# Patient Record
Sex: Male | Born: 1940
Health system: Southern US, Community
[De-identification: ages and names within clinical notes are randomized; demographics above are authoritative.]

## PROBLEM LIST (undated history)

## (undated) DIAGNOSIS — I10 Essential (primary) hypertension: Secondary | ICD-10-CM

## (undated) DIAGNOSIS — E785 Hyperlipidemia, unspecified: Secondary | ICD-10-CM

## (undated) DIAGNOSIS — K859 Acute pancreatitis without necrosis or infection, unspecified: Secondary | ICD-10-CM

## (undated) HISTORY — PX: CHOLECYSTECTOMY: SHX55

## (undated) HISTORY — DX: Essential (primary) hypertension: I10

## (undated) HISTORY — DX: Hyperlipidemia, unspecified: E78.5

## (undated) HISTORY — DX: Acute pancreatitis without necrosis or infection, unspecified: K85.90

---

## 2005-12-16 ENCOUNTER — Inpatient Hospital Stay (HOSPITAL_COMMUNITY): Admission: EM | Admit: 2005-12-16 | Discharge: 2005-12-17 | Payer: Self-pay | Admitting: Emergency Medicine

## 2006-08-08 ENCOUNTER — Ambulatory Visit (HOSPITAL_BASED_OUTPATIENT_CLINIC_OR_DEPARTMENT_OTHER): Admission: RE | Admit: 2006-08-08 | Discharge: 2006-08-09 | Payer: Self-pay | Admitting: Orthopedic Surgery

## 2010-06-30 DIAGNOSIS — K859 Acute pancreatitis without necrosis or infection, unspecified: Secondary | ICD-10-CM

## 2010-06-30 HISTORY — DX: Acute pancreatitis without necrosis or infection, unspecified: K85.90

## 2010-07-15 ENCOUNTER — Ambulatory Visit: Payer: Self-pay | Admitting: Gastroenterology

## 2010-07-16 ENCOUNTER — Inpatient Hospital Stay (HOSPITAL_COMMUNITY): Admission: EM | Admit: 2010-07-16 | Discharge: 2010-08-12 | Payer: Self-pay | Admitting: Emergency Medicine

## 2010-07-22 ENCOUNTER — Encounter (INDEPENDENT_AMBULATORY_CARE_PROVIDER_SITE_OTHER): Payer: Self-pay | Admitting: Internal Medicine

## 2010-08-25 ENCOUNTER — Ambulatory Visit (HOSPITAL_COMMUNITY): Admission: RE | Admit: 2010-08-25 | Discharge: 2010-08-25 | Payer: Self-pay

## 2010-08-30 HISTORY — PX: STERNOTOMY: SHX1057

## 2010-09-12 ENCOUNTER — Ambulatory Visit (HOSPITAL_COMMUNITY): Admission: RE | Admit: 2010-09-12 | Discharge: 2010-09-12 | Payer: Self-pay

## 2010-09-21 ENCOUNTER — Ambulatory Visit: Payer: Self-pay | Admitting: Oncology

## 2010-09-26 LAB — CBC WITH DIFFERENTIAL/PLATELET
BASO%: 0.1 % (ref 0.0–2.0)
EOS%: 3.5 % (ref 0.0–7.0)
LYMPH%: 46.2 % (ref 14.0–49.0)
MCH: 32.9 pg (ref 27.2–33.4)
MCHC: 34.8 g/dL (ref 32.0–36.0)
MCV: 94.7 fL (ref 79.3–98.0)
MONO%: 11.9 % (ref 0.0–14.0)
Platelets: 274 10*3/uL (ref 140–400)
RBC: 4.06 10*6/uL — ABNORMAL LOW (ref 4.20–5.82)

## 2010-09-26 LAB — COMPREHENSIVE METABOLIC PANEL
ALT: 10 U/L (ref 0–53)
Alkaline Phosphatase: 63 U/L (ref 39–117)
Glucose, Bld: 105 mg/dL — ABNORMAL HIGH (ref 70–99)
Sodium: 140 mEq/L (ref 135–145)
Total Bilirubin: 0.5 mg/dL (ref 0.3–1.2)
Total Protein: 6.6 g/dL (ref 6.0–8.3)

## 2010-10-03 ENCOUNTER — Ambulatory Visit
Admission: RE | Admit: 2010-10-03 | Discharge: 2010-10-27 | Payer: Self-pay | Source: Home / Self Care | Attending: Radiation Oncology | Admitting: Radiation Oncology

## 2010-10-11 ENCOUNTER — Ambulatory Visit: Payer: Self-pay | Admitting: Thoracic Surgery

## 2010-10-17 ENCOUNTER — Encounter: Payer: Self-pay | Admitting: Thoracic Surgery

## 2010-10-17 ENCOUNTER — Inpatient Hospital Stay (HOSPITAL_COMMUNITY)
Admission: RE | Admit: 2010-10-17 | Discharge: 2010-10-20 | Payer: Self-pay | Source: Home / Self Care | Attending: Thoracic Surgery | Admitting: Thoracic Surgery

## 2010-10-17 HISTORY — PX: STERNOTOMY: SHX1057

## 2010-10-25 ENCOUNTER — Ambulatory Visit: Payer: Self-pay | Admitting: Cardiothoracic Surgery

## 2010-10-26 ENCOUNTER — Ambulatory Visit: Payer: Self-pay | Admitting: Oncology

## 2010-11-10 ENCOUNTER — Ambulatory Visit
Admission: RE | Admit: 2010-11-10 | Discharge: 2010-11-10 | Payer: Self-pay | Source: Home / Self Care | Attending: Cardiothoracic Surgery | Admitting: Cardiothoracic Surgery

## 2010-11-10 ENCOUNTER — Encounter
Admission: RE | Admit: 2010-11-10 | Discharge: 2010-11-10 | Payer: Self-pay | Source: Home / Self Care | Attending: Cardiothoracic Surgery | Admitting: Cardiothoracic Surgery

## 2010-11-17 ENCOUNTER — Ambulatory Visit
Admission: RE | Admit: 2010-11-17 | Discharge: 2010-11-29 | Payer: Self-pay | Source: Home / Self Care | Attending: Radiation Oncology | Admitting: Radiation Oncology

## 2010-11-18 ENCOUNTER — Other Ambulatory Visit: Payer: Self-pay | Admitting: Oncology

## 2011-01-09 LAB — POCT I-STAT 3, ART BLOOD GAS (G3+)
Patient temperature: 97.7
pCO2 arterial: 35.7 mmHg (ref 35.0–45.0)
pH, Arterial: 7.481 — ABNORMAL HIGH (ref 7.350–7.450)

## 2011-01-09 LAB — BLOOD GAS, ARTERIAL
Bicarbonate: 24.6 mEq/L — ABNORMAL HIGH (ref 20.0–24.0)
Patient temperature: 98.6
TCO2: 25.7 mmol/L (ref 0–100)
pH, Arterial: 7.427 (ref 7.350–7.450)

## 2011-01-09 LAB — CBC
HCT: 33.9 % — ABNORMAL LOW (ref 39.0–52.0)
HCT: 38 % — ABNORMAL LOW (ref 39.0–52.0)
Hemoglobin: 11.8 g/dL — ABNORMAL LOW (ref 13.0–17.0)
Hemoglobin: 13.3 g/dL (ref 13.0–17.0)
MCH: 31.1 pg (ref 26.0–34.0)
MCH: 31.8 pg (ref 26.0–34.0)
MCH: 32.2 pg (ref 26.0–34.0)
MCHC: 33.8 g/dL (ref 30.0–36.0)
MCHC: 34.8 g/dL (ref 30.0–36.0)
MCHC: 35 g/dL (ref 30.0–36.0)
MCV: 92.6 fL (ref 78.0–100.0)
Platelets: 204 10*3/uL (ref 150–400)
Platelets: 209 10*3/uL (ref 150–400)
RBC: 3.66 MIL/uL — ABNORMAL LOW (ref 4.22–5.81)
RBC: 3.71 MIL/uL — ABNORMAL LOW (ref 4.22–5.81)
RDW: 14.3 % (ref 11.5–15.5)
RDW: 14.4 % (ref 11.5–15.5)
RDW: 14.6 % (ref 11.5–15.5)
WBC: 5.9 10*3/uL (ref 4.0–10.5)
WBC: 7.5 10*3/uL (ref 4.0–10.5)

## 2011-01-09 LAB — COMPREHENSIVE METABOLIC PANEL
ALT: 10 U/L (ref 0–53)
AST: 15 U/L (ref 0–37)
Albumin: 3.3 g/dL — ABNORMAL LOW (ref 3.5–5.2)
Alkaline Phosphatase: 60 U/L (ref 39–117)
BUN: 5 mg/dL — ABNORMAL LOW (ref 6–23)
CO2: 24 mEq/L (ref 19–32)
Chloride: 102 mEq/L (ref 96–112)
Chloride: 106 mEq/L (ref 96–112)
Creatinine, Ser: 0.8 mg/dL (ref 0.4–1.5)
GFR calc Af Amer: >60 mL/min (ref >60)
GFR calc non Af Amer: >60 mL/min (ref >60)
Glucose, Bld: 111 mg/dL — ABNORMAL HIGH (ref 70–99)
Potassium: 3.9 mEq/L (ref 3.5–5.1)
Potassium: 3.9 mEq/L (ref 3.5–5.1)
Sodium: 138 mEq/L (ref 135–145)
Total Bilirubin: 0.7 mg/dL (ref 0.3–1.2)
Total Bilirubin: 0.8 mg/dL (ref 0.3–1.2)
Total Protein: 6.2 g/dL (ref 6.0–8.3)

## 2011-01-09 LAB — URINE MICROSCOPIC-ADD ON

## 2011-01-09 LAB — BASIC METABOLIC PANEL
BUN: 11 mg/dL (ref 6–23)
BUN: 8 mg/dL (ref 6–23)
CO2: 26 mEq/L (ref 19–32)
Calcium: 8.5 mg/dL (ref 8.4–10.5)
Calcium: 9.2 mg/dL (ref 8.4–10.5)
Chloride: 101 mEq/L (ref 96–112)
Creatinine, Ser: 0.68 mg/dL (ref 0.4–1.5)
Creatinine, Ser: 0.82 mg/dL (ref 0.4–1.5)
GFR calc Af Amer: >60 mL/min (ref >60)
GFR calc non Af Amer: >60 mL/min (ref >60)
GFR calc non Af Amer: >60 mL/min (ref >60)
Glucose, Bld: 114 mg/dL — ABNORMAL HIGH (ref 70–99)
Glucose, Bld: 165 mg/dL — ABNORMAL HIGH (ref 70–99)
Potassium: 4 mEq/L (ref 3.5–5.1)
Sodium: 135 mEq/L (ref 135–145)

## 2011-01-09 LAB — TYPE AND SCREEN: Antibody Screen: NEGATIVE

## 2011-01-09 LAB — POCT I-STAT 4, (NA,K, GLUC, HGB,HCT)
HCT: 36 % — ABNORMAL LOW (ref 39.0–52.0)
Hemoglobin: 12.2 g/dL — ABNORMAL LOW (ref 13.0–17.0)
Potassium: 3.8 mEq/L (ref 3.5–5.1)

## 2011-01-09 LAB — URINALYSIS, ROUTINE W REFLEX MICROSCOPIC
Bilirubin Urine: NEGATIVE
Hgb urine dipstick: NEGATIVE
Specific Gravity, Urine: 1.018 (ref 1.005–1.030)
pH: 5.5 (ref 5.0–8.0)

## 2011-01-09 LAB — PROTIME-INR: Prothrombin Time: 13.3 seconds (ref 11.6–15.2)

## 2011-01-10 LAB — CBC
MCH: 31.3 pg (ref 26.0–34.0)
MCHC: 33.5 g/dL (ref 30.0–36.0)
MCV: 93.5 fL (ref 78.0–100.0)
Platelets: 241 10*3/uL (ref 150–400)
RBC: 4.18 MIL/uL — ABNORMAL LOW (ref 4.22–5.81)
RDW: 13.2 % (ref 11.5–15.5)

## 2011-01-10 LAB — PROTIME-INR: Prothrombin Time: 14.1 seconds (ref 11.6–15.2)

## 2011-01-10 LAB — TISSUE HYBRIDIZATION (BONE MARROW)-NCBH

## 2011-01-11 ENCOUNTER — Other Ambulatory Visit: Payer: Self-pay | Admitting: Surgery

## 2011-01-11 ENCOUNTER — Encounter (HOSPITAL_COMMUNITY): Payer: Medicare Other | Attending: Surgery

## 2011-01-11 ENCOUNTER — Other Ambulatory Visit (HOSPITAL_COMMUNITY): Payer: Medicare Other

## 2011-01-11 DIAGNOSIS — Z01812 Encounter for preprocedural laboratory examination: Secondary | ICD-10-CM | POA: Insufficient documentation

## 2011-01-11 LAB — CBC
HCT: 40.5 % (ref 39.0–52.0)
MCH: 32.8 pg (ref 26.0–34.0)
MCV: 95.5 fL (ref 78.0–100.0)
RDW: 13.7 % (ref 11.5–15.5)
WBC: 5.5 10*3/uL (ref 4.0–10.5)

## 2011-01-11 LAB — DIFFERENTIAL
Eosinophils Absolute: 0.2 10*3/uL (ref 0.0–0.7)
Eosinophils Relative: 3 % (ref 0–5)
Lymphocytes Relative: 35 % (ref 12–46)
Lymphs Abs: 1.9 10*3/uL (ref 0.7–4.0)
Monocytes Relative: 7 % (ref 3–12)

## 2011-01-11 LAB — COMPREHENSIVE METABOLIC PANEL
Alkaline Phosphatase: 84 U/L (ref 39–117)
BUN: 16 mg/dL (ref 6–23)
Chloride: 104 mEq/L (ref 96–112)
Creatinine, Ser: 0.8 mg/dL (ref 0.4–1.5)
Glucose, Bld: 98 mg/dL (ref 70–99)
Potassium: 4 mEq/L (ref 3.5–5.1)
Total Bilirubin: 0.7 mg/dL (ref 0.3–1.2)
Total Protein: 7.2 g/dL (ref 6.0–8.3)

## 2011-01-11 LAB — SURGICAL PCR SCREEN: Staphylococcus aureus: NEGATIVE

## 2011-01-11 LAB — LIPASE, BLOOD: Lipase: 23 U/L (ref 11–59)

## 2011-01-11 LAB — GLUCOSE, CAPILLARY: Glucose-Capillary: 121 mg/dL — ABNORMAL HIGH (ref 70–99)

## 2011-01-12 LAB — CBC
HCT: 29.9 % — ABNORMAL LOW (ref 39.0–52.0)
HCT: 30.4 % — ABNORMAL LOW (ref 39.0–52.0)
HCT: 30.5 % — ABNORMAL LOW (ref 39.0–52.0)
HCT: 30.8 % — ABNORMAL LOW (ref 39.0–52.0)
HCT: 31.1 % — ABNORMAL LOW (ref 39.0–52.0)
HCT: 31.6 % — ABNORMAL LOW (ref 39.0–52.0)
HCT: 31.8 % — ABNORMAL LOW (ref 39.0–52.0)
HCT: 32.4 % — ABNORMAL LOW (ref 39.0–52.0)
HCT: 32.8 % — ABNORMAL LOW (ref 39.0–52.0)
HCT: 34.9 % — ABNORMAL LOW (ref 39.0–52.0)
HCT: 35.6 % — ABNORMAL LOW (ref 39.0–52.0)
HCT: 38.6 % — ABNORMAL LOW (ref 39.0–52.0)
HCT: 40.6 % (ref 39.0–52.0)
Hemoglobin: 10.3 g/dL — ABNORMAL LOW (ref 13.0–17.0)
Hemoglobin: 10.3 g/dL — ABNORMAL LOW (ref 13.0–17.0)
Hemoglobin: 10.5 g/dL — ABNORMAL LOW (ref 13.0–17.0)
Hemoglobin: 10.6 g/dL — ABNORMAL LOW (ref 13.0–17.0)
Hemoglobin: 10.8 g/dL — ABNORMAL LOW (ref 13.0–17.0)
Hemoglobin: 10.8 g/dL — ABNORMAL LOW (ref 13.0–17.0)
Hemoglobin: 11.1 g/dL — ABNORMAL LOW (ref 13.0–17.0)
Hemoglobin: 11.3 g/dL — ABNORMAL LOW (ref 13.0–17.0)
Hemoglobin: 11.7 g/dL — ABNORMAL LOW (ref 13.0–17.0)
Hemoglobin: 12.4 g/dL — ABNORMAL LOW (ref 13.0–17.0)
Hemoglobin: 12.8 g/dL — ABNORMAL LOW (ref 13.0–17.0)
Hemoglobin: 14.1 g/dL (ref 13.0–17.0)
Hemoglobin: 16.1 g/dL (ref 13.0–17.0)
MCH: 31.4 pg (ref 26.0–34.0)
MCH: 31.6 pg (ref 26.0–34.0)
MCH: 31.6 pg (ref 26.0–34.0)
MCH: 31.6 pg (ref 26.0–34.0)
MCH: 31.7 pg (ref 26.0–34.0)
MCH: 31.7 pg (ref 26.0–34.0)
MCH: 31.8 pg (ref 26.0–34.0)
MCH: 31.9 pg (ref 26.0–34.0)
MCH: 32 pg (ref 26.0–34.0)
MCH: 32.1 pg (ref 26.0–34.0)
MCH: 32.1 pg (ref 26.0–34.0)
MCH: 32.2 pg (ref 26.0–34.0)
MCH: 32.3 pg (ref 26.0–34.0)
MCH: 32.5 pg (ref 26.0–34.0)
MCH: 32.7 pg (ref 26.0–34.0)
MCH: 32.9 pg (ref 26.0–34.0)
MCH: 33.5 pg (ref 26.0–34.0)
MCHC: 33.2 g/dL (ref 30.0–36.0)
MCHC: 33.5 g/dL (ref 30.0–36.0)
MCHC: 33.8 g/dL (ref 30.0–36.0)
MCHC: 33.8 g/dL (ref 30.0–36.0)
MCHC: 34.1 g/dL (ref 30.0–36.0)
MCHC: 34.2 g/dL (ref 30.0–36.0)
MCHC: 34.2 g/dL (ref 30.0–36.0)
MCHC: 34.4 g/dL (ref 30.0–36.0)
MCHC: 34.5 g/dL (ref 30.0–36.0)
MCHC: 34.5 g/dL (ref 30.0–36.0)
MCHC: 34.8 g/dL (ref 30.0–36.0)
MCHC: 34.9 g/dL (ref 30.0–36.0)
MCHC: 34.9 g/dL (ref 30.0–36.0)
MCHC: 35 g/dL (ref 30.0–36.0)
MCHC: 35.4 g/dL (ref 30.0–36.0)
MCHC: 35.5 g/dL (ref 30.0–36.0)
MCHC: 35.5 g/dL (ref 30.0–36.0)
MCHC: 35.5 g/dL (ref 30.0–36.0)
MCHC: 35.7 g/dL (ref 30.0–36.0)
MCHC: 35.8 g/dL (ref 30.0–36.0)
MCV: 90.4 fL (ref 78.0–100.0)
MCV: 90.6 fL (ref 78.0–100.0)
MCV: 91.8 fL (ref 78.0–100.0)
MCV: 92.3 fL (ref 78.0–100.0)
MCV: 92.4 fL (ref 78.0–100.0)
MCV: 92.6 fL (ref 78.0–100.0)
MCV: 92.9 fL (ref 78.0–100.0)
MCV: 93.1 fL (ref 78.0–100.0)
MCV: 93.3 fL (ref 78.0–100.0)
MCV: 93.8 fL (ref 78.0–100.0)
MCV: 93.9 fL (ref 78.0–100.0)
MCV: 94.2 fL (ref 78.0–100.0)
MCV: 94.4 fL (ref 78.0–100.0)
MCV: 94.5 fL (ref 78.0–100.0)
MCV: 95.8 fL (ref 78.0–100.0)
Platelets: 123 10*3/uL — ABNORMAL LOW (ref 150–400)
Platelets: 129 10*3/uL — ABNORMAL LOW (ref 150–400)
Platelets: 215 10*3/uL (ref 150–400)
Platelets: 269 10*3/uL (ref 150–400)
Platelets: 343 10*3/uL (ref 150–400)
Platelets: 381 10*3/uL (ref 150–400)
Platelets: 399 10*3/uL (ref 150–400)
Platelets: 409 10*3/uL — ABNORMAL HIGH (ref 150–400)
Platelets: 446 10*3/uL — ABNORMAL HIGH (ref 150–400)
Platelets: 454 10*3/uL — ABNORMAL HIGH (ref 150–400)
Platelets: 458 10*3/uL — ABNORMAL HIGH (ref 150–400)
Platelets: 465 10*3/uL — ABNORMAL HIGH (ref 150–400)
Platelets: 478 10*3/uL — ABNORMAL HIGH (ref 150–400)
Platelets: 480 10*3/uL — ABNORMAL HIGH (ref 150–400)
RBC: 3.26 MIL/uL — ABNORMAL LOW (ref 4.22–5.81)
RBC: 3.32 MIL/uL — ABNORMAL LOW (ref 4.22–5.81)
RBC: 3.4 MIL/uL — ABNORMAL LOW (ref 4.22–5.81)
RBC: 3.43 MIL/uL — ABNORMAL LOW (ref 4.22–5.81)
RBC: 3.44 MIL/uL — ABNORMAL LOW (ref 4.22–5.81)
RBC: 3.52 MIL/uL — ABNORMAL LOW (ref 4.22–5.81)
RBC: 3.57 MIL/uL — ABNORMAL LOW (ref 4.22–5.81)
RBC: 3.66 MIL/uL — ABNORMAL LOW (ref 4.22–5.81)
RBC: 4.24 MIL/uL (ref 4.22–5.81)
RBC: 5.08 MIL/uL (ref 4.22–5.81)
RDW: 13.4 % (ref 11.5–15.5)
RDW: 13.5 % (ref 11.5–15.5)
RDW: 13.5 % (ref 11.5–15.5)
RDW: 13.5 % (ref 11.5–15.5)
RDW: 13.5 % (ref 11.5–15.5)
RDW: 13.5 % (ref 11.5–15.5)
RDW: 13.6 % (ref 11.5–15.5)
RDW: 13.6 % (ref 11.5–15.5)
RDW: 13.7 % (ref 11.5–15.5)
RDW: 13.7 % (ref 11.5–15.5)
RDW: 13.7 % (ref 11.5–15.5)
RDW: 13.8 % (ref 11.5–15.5)
RDW: 13.8 % (ref 11.5–15.5)
WBC: 11.7 10*3/uL — ABNORMAL HIGH (ref 4.0–10.5)
WBC: 14.7 10*3/uL — ABNORMAL HIGH (ref 4.0–10.5)
WBC: 15.1 10*3/uL — ABNORMAL HIGH (ref 4.0–10.5)
WBC: 16.4 10*3/uL — ABNORMAL HIGH (ref 4.0–10.5)
WBC: 17.3 10*3/uL — ABNORMAL HIGH (ref 4.0–10.5)
WBC: 3.5 10*3/uL — ABNORMAL LOW (ref 4.0–10.5)
WBC: 3.5 10*3/uL — ABNORMAL LOW (ref 4.0–10.5)
WBC: 3.8 10*3/uL — ABNORMAL LOW (ref 4.0–10.5)
WBC: 3.9 10*3/uL — ABNORMAL LOW (ref 4.0–10.5)
WBC: 7.4 10*3/uL (ref 4.0–10.5)
WBC: 8.7 10*3/uL (ref 4.0–10.5)

## 2011-01-12 LAB — URINE MICROSCOPIC-ADD ON

## 2011-01-12 LAB — GLUCOSE, CAPILLARY
Glucose-Capillary: 110 mg/dL — ABNORMAL HIGH (ref 70–99)
Glucose-Capillary: 113 mg/dL — ABNORMAL HIGH (ref 70–99)
Glucose-Capillary: 115 mg/dL — ABNORMAL HIGH (ref 70–99)
Glucose-Capillary: 115 mg/dL — ABNORMAL HIGH (ref 70–99)
Glucose-Capillary: 117 mg/dL — ABNORMAL HIGH (ref 70–99)
Glucose-Capillary: 123 mg/dL — ABNORMAL HIGH (ref 70–99)
Glucose-Capillary: 124 mg/dL — ABNORMAL HIGH (ref 70–99)
Glucose-Capillary: 124 mg/dL — ABNORMAL HIGH (ref 70–99)
Glucose-Capillary: 124 mg/dL — ABNORMAL HIGH (ref 70–99)
Glucose-Capillary: 125 mg/dL — ABNORMAL HIGH (ref 70–99)
Glucose-Capillary: 126 mg/dL — ABNORMAL HIGH (ref 70–99)
Glucose-Capillary: 128 mg/dL — ABNORMAL HIGH (ref 70–99)
Glucose-Capillary: 130 mg/dL — ABNORMAL HIGH (ref 70–99)
Glucose-Capillary: 133 mg/dL — ABNORMAL HIGH (ref 70–99)
Glucose-Capillary: 134 mg/dL — ABNORMAL HIGH (ref 70–99)
Glucose-Capillary: 134 mg/dL — ABNORMAL HIGH (ref 70–99)
Glucose-Capillary: 134 mg/dL — ABNORMAL HIGH (ref 70–99)
Glucose-Capillary: 134 mg/dL — ABNORMAL HIGH (ref 70–99)
Glucose-Capillary: 140 mg/dL — ABNORMAL HIGH (ref 70–99)
Glucose-Capillary: 141 mg/dL — ABNORMAL HIGH (ref 70–99)
Glucose-Capillary: 142 mg/dL — ABNORMAL HIGH (ref 70–99)
Glucose-Capillary: 143 mg/dL — ABNORMAL HIGH (ref 70–99)
Glucose-Capillary: 143 mg/dL — ABNORMAL HIGH (ref 70–99)
Glucose-Capillary: 145 mg/dL — ABNORMAL HIGH (ref 70–99)
Glucose-Capillary: 146 mg/dL — ABNORMAL HIGH (ref 70–99)
Glucose-Capillary: 147 mg/dL — ABNORMAL HIGH (ref 70–99)
Glucose-Capillary: 148 mg/dL — ABNORMAL HIGH (ref 70–99)
Glucose-Capillary: 149 mg/dL — ABNORMAL HIGH (ref 70–99)
Glucose-Capillary: 150 mg/dL — ABNORMAL HIGH (ref 70–99)
Glucose-Capillary: 154 mg/dL — ABNORMAL HIGH (ref 70–99)
Glucose-Capillary: 155 mg/dL — ABNORMAL HIGH (ref 70–99)
Glucose-Capillary: 156 mg/dL — ABNORMAL HIGH (ref 70–99)
Glucose-Capillary: 163 mg/dL — ABNORMAL HIGH (ref 70–99)
Glucose-Capillary: 164 mg/dL — ABNORMAL HIGH (ref 70–99)
Glucose-Capillary: 167 mg/dL — ABNORMAL HIGH (ref 70–99)
Glucose-Capillary: 170 mg/dL — ABNORMAL HIGH (ref 70–99)
Glucose-Capillary: 179 mg/dL — ABNORMAL HIGH (ref 70–99)
Glucose-Capillary: 89 mg/dL (ref 70–99)
Glucose-Capillary: 98 mg/dL (ref 70–99)

## 2011-01-12 LAB — COMPREHENSIVE METABOLIC PANEL
ALT: 17 U/L (ref 0–53)
ALT: 37 U/L (ref 0–53)
AST: 20 U/L (ref 0–37)
AST: 21 U/L (ref 0–37)
AST: 24 U/L (ref 0–37)
AST: 27 U/L (ref 0–37)
AST: 28 U/L (ref 0–37)
AST: 39 U/L — ABNORMAL HIGH (ref 0–37)
AST: 44 U/L — ABNORMAL HIGH (ref 0–37)
Albumin: 2.4 g/dL — ABNORMAL LOW (ref 3.5–5.2)
Albumin: 2.5 g/dL — ABNORMAL LOW (ref 3.5–5.2)
Albumin: 2.5 g/dL — ABNORMAL LOW (ref 3.5–5.2)
Albumin: 2.7 g/dL — ABNORMAL LOW (ref 3.5–5.2)
Albumin: 2.7 g/dL — ABNORMAL LOW (ref 3.5–5.2)
Albumin: 2.8 g/dL — ABNORMAL LOW (ref 3.5–5.2)
Albumin: 4.3 g/dL (ref 3.5–5.2)
Alkaline Phosphatase: 131 U/L — ABNORMAL HIGH (ref 39–117)
Alkaline Phosphatase: 156 U/L — ABNORMAL HIGH (ref 39–117)
Alkaline Phosphatase: 422 U/L — ABNORMAL HIGH (ref 39–117)
BUN: 10 mg/dL (ref 6–23)
BUN: 14 mg/dL (ref 6–23)
BUN: 15 mg/dL (ref 6–23)
BUN: 15 mg/dL (ref 6–23)
BUN: 17 mg/dL (ref 6–23)
BUN: 18 mg/dL (ref 6–23)
CO2: 26 mEq/L (ref 19–32)
CO2: 27 mEq/L (ref 19–32)
Calcium: 7.5 mg/dL — ABNORMAL LOW (ref 8.4–10.5)
Calcium: 7.5 mg/dL — ABNORMAL LOW (ref 8.4–10.5)
Calcium: 8.1 mg/dL — ABNORMAL LOW (ref 8.4–10.5)
Calcium: 8.4 mg/dL (ref 8.4–10.5)
Calcium: 8.7 mg/dL (ref 8.4–10.5)
Calcium: 8.7 mg/dL (ref 8.4–10.5)
Calcium: 8.8 mg/dL (ref 8.4–10.5)
Calcium: 8.8 mg/dL (ref 8.4–10.5)
Calcium: 9.3 mg/dL (ref 8.4–10.5)
Chloride: 100 mEq/L (ref 96–112)
Chloride: 102 mEq/L (ref 96–112)
Creatinine, Ser: 0.65 mg/dL (ref 0.4–1.5)
Creatinine, Ser: 0.67 mg/dL (ref 0.4–1.5)
Creatinine, Ser: 0.72 mg/dL (ref 0.4–1.5)
Creatinine, Ser: 0.75 mg/dL (ref 0.4–1.5)
Creatinine, Ser: 0.8 mg/dL (ref 0.4–1.5)
Creatinine, Ser: 0.82 mg/dL (ref 0.4–1.5)
Creatinine, Ser: 0.82 mg/dL (ref 0.4–1.5)
Creatinine, Ser: 1.45 mg/dL (ref 0.4–1.5)
GFR calc Af Amer: 58 mL/min — ABNORMAL LOW (ref >60)
GFR calc Af Amer: >60 mL/min (ref >60)
GFR calc Af Amer: >60 mL/min (ref >60)
GFR calc Af Amer: >60 mL/min (ref >60)
GFR calc Af Amer: >60 mL/min (ref >60)
GFR calc Af Amer: >60 mL/min (ref >60)
GFR calc non Af Amer: >60 mL/min (ref >60)
GFR calc non Af Amer: >60 mL/min (ref >60)
GFR calc non Af Amer: >60 mL/min (ref >60)
Glucose, Bld: 118 mg/dL — ABNORMAL HIGH (ref 70–99)
Glucose, Bld: 154 mg/dL — ABNORMAL HIGH (ref 70–99)
Glucose, Bld: 97 mg/dL (ref 70–99)
Potassium: 4.1 mEq/L (ref 3.5–5.1)
Potassium: 4.1 mEq/L (ref 3.5–5.1)
Potassium: 4.1 mEq/L (ref 3.5–5.1)
Sodium: 131 mEq/L — ABNORMAL LOW (ref 135–145)
Sodium: 133 mEq/L — ABNORMAL LOW (ref 135–145)
Sodium: 135 mEq/L (ref 135–145)
Total Bilirubin: 0.5 mg/dL (ref 0.3–1.2)
Total Protein: 4.9 g/dL — ABNORMAL LOW (ref 6.0–8.3)
Total Protein: 5.1 g/dL — ABNORMAL LOW (ref 6.0–8.3)
Total Protein: 5.8 g/dL — ABNORMAL LOW (ref 6.0–8.3)
Total Protein: 5.9 g/dL — ABNORMAL LOW (ref 6.0–8.3)
Total Protein: 5.9 g/dL — ABNORMAL LOW (ref 6.0–8.3)
Total Protein: 6 g/dL (ref 6.0–8.3)
Total Protein: 7 g/dL (ref 6.0–8.3)

## 2011-01-12 LAB — BASIC METABOLIC PANEL
BUN: 10 mg/dL (ref 6–23)
BUN: 10 mg/dL (ref 6–23)
BUN: 14 mg/dL (ref 6–23)
BUN: 15 mg/dL (ref 6–23)
BUN: 16 mg/dL (ref 6–23)
BUN: 17 mg/dL (ref 6–23)
BUN: 17 mg/dL (ref 6–23)
BUN: 18 mg/dL (ref 6–23)
BUN: 19 mg/dL (ref 6–23)
BUN: 23 mg/dL (ref 6–23)
BUN: 7 mg/dL (ref 6–23)
CO2: 21 mEq/L (ref 19–32)
CO2: 21 mEq/L (ref 19–32)
CO2: 23 mEq/L (ref 19–32)
CO2: 23 mEq/L (ref 19–32)
CO2: 24 mEq/L (ref 19–32)
CO2: 24 mEq/L (ref 19–32)
CO2: 25 mEq/L (ref 19–32)
CO2: 25 mEq/L (ref 19–32)
CO2: 26 mEq/L (ref 19–32)
CO2: 26 mEq/L (ref 19–32)
CO2: 26 mEq/L (ref 19–32)
CO2: 28 mEq/L (ref 19–32)
CO2: 28 mEq/L (ref 19–32)
Calcium: 6.5 mg/dL — ABNORMAL LOW (ref 8.4–10.5)
Calcium: 7.1 mg/dL — ABNORMAL LOW (ref 8.4–10.5)
Calcium: 7.5 mg/dL — ABNORMAL LOW (ref 8.4–10.5)
Calcium: 7.5 mg/dL — ABNORMAL LOW (ref 8.4–10.5)
Calcium: 7.7 mg/dL — ABNORMAL LOW (ref 8.4–10.5)
Calcium: 7.8 mg/dL — ABNORMAL LOW (ref 8.4–10.5)
Calcium: 8.5 mg/dL (ref 8.4–10.5)
Calcium: 8.6 mg/dL (ref 8.4–10.5)
Calcium: 8.6 mg/dL (ref 8.4–10.5)
Calcium: 8.8 mg/dL (ref 8.4–10.5)
Chloride: 101 mEq/L (ref 96–112)
Chloride: 101 mEq/L (ref 96–112)
Chloride: 104 mEq/L (ref 96–112)
Chloride: 105 mEq/L (ref 96–112)
Chloride: 105 mEq/L (ref 96–112)
Chloride: 107 mEq/L (ref 96–112)
Chloride: 108 mEq/L (ref 96–112)
Chloride: 110 mEq/L (ref 96–112)
Chloride: 98 mEq/L (ref 96–112)
Chloride: 99 mEq/L (ref 96–112)
Chloride: 99 mEq/L (ref 96–112)
Creatinine, Ser: 0.68 mg/dL (ref 0.4–1.5)
Creatinine, Ser: 0.69 mg/dL (ref 0.4–1.5)
Creatinine, Ser: 0.73 mg/dL (ref 0.4–1.5)
Creatinine, Ser: 0.74 mg/dL (ref 0.4–1.5)
Creatinine, Ser: 0.75 mg/dL (ref 0.4–1.5)
Creatinine, Ser: 0.8 mg/dL (ref 0.4–1.5)
Creatinine, Ser: 0.83 mg/dL (ref 0.4–1.5)
Creatinine, Ser: 0.92 mg/dL (ref 0.4–1.5)
Creatinine, Ser: 0.94 mg/dL (ref 0.4–1.5)
GFR calc Af Amer: >60 mL/min (ref >60)
GFR calc Af Amer: >60 mL/min (ref >60)
GFR calc Af Amer: >60 mL/min (ref >60)
GFR calc Af Amer: >60 mL/min (ref >60)
GFR calc Af Amer: >60 mL/min (ref >60)
GFR calc Af Amer: >60 mL/min (ref >60)
GFR calc Af Amer: >60 mL/min (ref >60)
GFR calc Af Amer: >60 mL/min (ref >60)
GFR calc non Af Amer: >60 mL/min (ref >60)
GFR calc non Af Amer: >60 mL/min (ref >60)
GFR calc non Af Amer: >60 mL/min (ref >60)
GFR calc non Af Amer: >60 mL/min (ref >60)
GFR calc non Af Amer: >60 mL/min (ref >60)
GFR calc non Af Amer: >60 mL/min (ref >60)
GFR calc non Af Amer: >60 mL/min (ref >60)
Glucose, Bld: 100 mg/dL — ABNORMAL HIGH (ref 70–99)
Glucose, Bld: 114 mg/dL — ABNORMAL HIGH (ref 70–99)
Glucose, Bld: 133 mg/dL — ABNORMAL HIGH (ref 70–99)
Glucose, Bld: 140 mg/dL — ABNORMAL HIGH (ref 70–99)
Glucose, Bld: 142 mg/dL — ABNORMAL HIGH (ref 70–99)
Glucose, Bld: 145 mg/dL — ABNORMAL HIGH (ref 70–99)
Glucose, Bld: 145 mg/dL — ABNORMAL HIGH (ref 70–99)
Glucose, Bld: 72 mg/dL (ref 70–99)
Glucose, Bld: 76 mg/dL (ref 70–99)
Glucose, Bld: 91 mg/dL (ref 70–99)
Glucose, Bld: 94 mg/dL (ref 70–99)
Potassium: 2.6 mEq/L — CL (ref 3.5–5.1)
Potassium: 3.1 mEq/L — ABNORMAL LOW (ref 3.5–5.1)
Potassium: 3.2 mEq/L — ABNORMAL LOW (ref 3.5–5.1)
Potassium: 3.8 mEq/L (ref 3.5–5.1)
Potassium: 4 mEq/L (ref 3.5–5.1)
Potassium: 4.2 mEq/L (ref 3.5–5.1)
Potassium: 4.2 mEq/L (ref 3.5–5.1)
Potassium: 4.4 mEq/L (ref 3.5–5.1)
Sodium: 133 mEq/L — ABNORMAL LOW (ref 135–145)
Sodium: 135 mEq/L (ref 135–145)
Sodium: 135 mEq/L (ref 135–145)
Sodium: 138 mEq/L (ref 135–145)
Sodium: 138 mEq/L (ref 135–145)
Sodium: 139 mEq/L (ref 135–145)
Sodium: 140 mEq/L (ref 135–145)
Sodium: 143 mEq/L (ref 135–145)
Sodium: 144 mEq/L (ref 135–145)

## 2011-01-12 LAB — TRIGLYCERIDES
Triglycerides: 112 mg/dL (ref <150)
Triglycerides: 116 mg/dL (ref <150)
Triglycerides: 130 mg/dL (ref <150)
Triglycerides: 144 mg/dL (ref <150)

## 2011-01-12 LAB — PATHOLOGIST SMEAR REVIEW

## 2011-01-12 LAB — DIFFERENTIAL
Basophils Absolute: 0 10*3/uL (ref 0.0–0.1)
Basophils Relative: 0 % (ref 0–1)
Basophils Relative: 1 % (ref 0–1)
Eosinophils Absolute: 0.2 10*3/uL (ref 0.0–0.7)
Eosinophils Absolute: 0.4 10*3/uL (ref 0.0–0.7)
Eosinophils Absolute: 0.8 10*3/uL — ABNORMAL HIGH (ref 0.0–0.7)
Eosinophils Relative: 0 % (ref 0–5)
Eosinophils Relative: 21 % — ABNORMAL HIGH (ref 0–5)
Eosinophils Relative: 5 % (ref 0–5)
Lymphocytes Relative: 4 % — ABNORMAL LOW (ref 12–46)
Lymphocytes Relative: 45 % (ref 12–46)
Lymphocytes Relative: 5 % — ABNORMAL LOW (ref 12–46)
Lymphocytes Relative: 6 % — ABNORMAL LOW (ref 12–46)
Lymphs Abs: 0.6 10*3/uL — ABNORMAL LOW (ref 0.7–4.0)
Lymphs Abs: 0.8 10*3/uL (ref 0.7–4.0)
Lymphs Abs: 0.9 10*3/uL (ref 0.7–4.0)
Lymphs Abs: 1.1 10*3/uL (ref 0.7–4.0)
Monocytes Absolute: 0.5 10*3/uL (ref 0.1–1.0)
Monocytes Absolute: 0.6 10*3/uL (ref 0.1–1.0)
Monocytes Relative: 4 % (ref 3–12)
Monocytes Relative: 6 % (ref 3–12)
Monocytes Relative: 7 % (ref 3–12)
Neutro Abs: 0.5 10*3/uL — ABNORMAL LOW (ref 1.7–7.7)
Neutro Abs: 14.8 10*3/uL — ABNORMAL HIGH (ref 1.7–7.7)
Neutrophils Relative %: 73 % (ref 43–77)
Neutrophils Relative %: 87 % — ABNORMAL HIGH (ref 43–77)
Neutrophils Relative %: 88 % — ABNORMAL HIGH (ref 43–77)

## 2011-01-12 LAB — URINALYSIS, ROUTINE W REFLEX MICROSCOPIC
Bilirubin Urine: NEGATIVE
Glucose, UA: NEGATIVE mg/dL
Ketones, ur: 15 mg/dL — AB
Nitrite: NEGATIVE
Specific Gravity, Urine: 1.019 (ref 1.005–1.030)
Specific Gravity, Urine: 1.021 (ref 1.005–1.030)
Urobilinogen, UA: 0.2 mg/dL (ref 0.0–1.0)
Urobilinogen, UA: 1 mg/dL (ref 0.0–1.0)
pH: 6.5 (ref 5.0–8.0)

## 2011-01-12 LAB — BLOOD GAS, ARTERIAL
Acid-base deficit: 2.6 mmol/L — ABNORMAL HIGH (ref 0.0–2.0)
Bicarbonate: 20.7 mEq/L (ref 20.0–24.0)
O2 Saturation: 98.1 %
Patient temperature: 99.7
TCO2: 21.7 mmol/L (ref 0–100)

## 2011-01-12 LAB — PHOSPHORUS
Phosphorus: 3.5 mg/dL (ref 2.3–4.6)
Phosphorus: 3.7 mg/dL (ref 2.3–4.6)
Phosphorus: 4.2 mg/dL (ref 2.3–4.6)

## 2011-01-12 LAB — LIPASE, BLOOD
Lipase: 1028 U/L — ABNORMAL HIGH (ref 11–59)
Lipase: 22 U/L (ref 11–59)
Lipase: 25 U/L (ref 11–59)
Lipase: 32 U/L (ref 11–59)
Lipase: 35 U/L (ref 11–59)

## 2011-01-12 LAB — MAGNESIUM
Magnesium: 1.7 mg/dL (ref 1.5–2.5)
Magnesium: 2 mg/dL (ref 1.5–2.5)
Magnesium: 2.1 mg/dL (ref 1.5–2.5)
Magnesium: 2.2 mg/dL (ref 1.5–2.5)

## 2011-01-12 LAB — MRSA CULTURE

## 2011-01-12 LAB — CALCIUM: Calcium: 9.3 mg/dL (ref 8.4–10.5)

## 2011-01-12 LAB — MRSA PCR SCREENING

## 2011-01-12 LAB — HEPATITIS B SURFACE ANTIGEN: Hepatitis B Surface Ag: NEGATIVE

## 2011-01-12 LAB — CHOLESTEROL, TOTAL: Cholesterol: 78 mg/dL (ref 0–200)

## 2011-01-12 LAB — PREALBUMIN
Prealbumin: 13.8 mg/dL — ABNORMAL LOW (ref 18.0–45.0)
Prealbumin: 21.8 mg/dL (ref 18.0–45.0)

## 2011-01-12 LAB — URINE CULTURE: Special Requests: NEGATIVE

## 2011-01-16 ENCOUNTER — Other Ambulatory Visit: Payer: Self-pay | Admitting: Surgery

## 2011-01-16 ENCOUNTER — Ambulatory Visit (HOSPITAL_COMMUNITY): Payer: Medicare Other

## 2011-01-16 ENCOUNTER — Ambulatory Visit (HOSPITAL_COMMUNITY)
Admission: RE | Admit: 2011-01-16 | Discharge: 2011-01-16 | Disposition: A | Discharge status: Home / Self Care | Payer: Medicare Other | Source: Ambulatory Visit | Attending: Surgery | Admitting: Surgery

## 2011-01-16 DIAGNOSIS — K8020 Calculus of gallbladder without cholecystitis without obstruction: Secondary | ICD-10-CM | POA: Insufficient documentation

## 2011-01-16 DIAGNOSIS — K859 Acute pancreatitis without necrosis or infection, unspecified: Secondary | ICD-10-CM | POA: Insufficient documentation

## 2011-01-16 HISTORY — PX: CHOLECYSTECTOMY: SHX55

## 2011-01-17 NOTE — Op Note (Signed)
NAMEFITZGERALD, DUNNE                  ACCOUNT NO.:  [id number]  MEDICAL RECORD NO.:  [id number]           PATIENT TYPE:  O  LOCATION:  DAYL                         FACILITY:  [facility]  PHYSICIAN:  Wilmon Arms. Corliss Skains, M.D. DATE OF BIRTH:  [birth year]  DATE OF PROCEDURE:  01/16/2011 DATE OF DISCHARGE:                              OPERATIVE REPORT   PREOPERATIVE DIAGNOSIS:  Gallstone pancreatitis.  POSTOPERATIVE DIAGNOSIS:  Gallstone pancreatitis.  PROCEDURE PERFORMED:  Laparoscopic cholecystectomy with intraoperative cholangiogram.  SURGEON:  Wilmon Arms. Corliss Skains, MD  ANESTHESIA:  General.  INDICATIONS:  This is a 70 year old male who was hospitalized last year with gallstone pancreatitis.  He took a considerable amount of time to recover.  He also had another surgical procedure with a median sternotomy for a thymoma.  He is doing well now.  He presents for laparoscopic cholecystectomy.  DESCRIPTION OF PROCEDURE:  The patient was brought to the operatingroom, placed in the supine position on operating table.  After an adequate level of general anesthesia was obtained, the patient's abdomen was prepped with ChloraPrep and draped in sterile fashion.  A time-out was taken to assure the proper patient and proper procedure.  We infiltrated the area below the umbilicus with 0.25% Marcaine with epinephrine.  A transverse incision was made.  Dissection was carried down to the fascia.  The fascia was opened vertically.  We entered the peritoneal cavity bluntly.  A stay suture of 0 Vicryl was placed around the fascial opening.  The Hasson cannula was inserted and secured to stay suture.  Pneumoperitoneum was obtained by insufflating CO2, maintaining a maximum pressure of 15 mmHg.  The laparoscope was inserted and the patient was positioned in reverse Trendelenburg and tilted to his left.  An 11-mm port was placed in the subxiphoid position.  Two 5- mm ports were placed in right upper quadrant.  The  gallbladder was grasped with a clamp and lifted.  There were minimal adhesions to the gallbladder.  We opened the peritoneum around the hilum of gallbladder and dissected around the cystic duct.  We also identified the cystic artery.  The cystic duct was ligated and clipped distally.  A small opening was created on the cystic duct.  A Cook cholangiogram catheter was inserted through a stab incision and threaded in the cystic duct.  A cholangiogram was then obtained which showed good flow proximally and distally biliary tree with no sign of filling defect.  The common duct was mildly dilated.  Contrast flowed easily into the duodenum.  There, no common duct stones were noted.  The catheter was removed and the cystic duct was ligated with clips and divided.  The cystic arteries were ligated with clips and divided.  Cautery was then used to dissect the gallbladder free from the liver.  The gallbladder fossa was inspected for hemostasis and irrigated thoroughly.  The gallbladder was then removed along with the EndoCatch sac through the umbilical port site.  The purse-string suture was used to close the fascia.  We thoroughly irrigated and suctioned as much irrigation as possible. Pneumoperitoneum was then released as I removed the  trocars.  A 4-0 Monocryl was used to close the skin.  Benzoin, Steri- Strips, and occlusive dressings were applied.  The patient was then extubated and brought to Recovery in stable condition.  All sponge, instrument, and needle counts were correct.     Wilmon Arms. Corliss Skains, M.D.     MKT/MEDQ  D:  01/16/2011  T:  01/16/2011  Job:  [id number]  Electronically Signed by Manus Rudd M.D. on 01/17/2011 10:37:07 AM

## 2011-02-02 ENCOUNTER — Encounter: Payer: Self-pay | Admitting: Cardiothoracic Surgery

## 2011-02-22 ENCOUNTER — Other Ambulatory Visit: Payer: Self-pay | Admitting: Cardiothoracic Surgery

## 2011-02-22 DIAGNOSIS — R222 Localized swelling, mass and lump, trunk: Secondary | ICD-10-CM

## 2011-02-23 ENCOUNTER — Ambulatory Visit
Admission: RE | Admit: 2011-02-23 | Discharge: 2011-02-23 | Disposition: A | Discharge status: Home / Self Care | Payer: Medicare Other | Source: Ambulatory Visit | Attending: Cardiothoracic Surgery | Admitting: Cardiothoracic Surgery

## 2011-02-23 ENCOUNTER — Encounter (INDEPENDENT_AMBULATORY_CARE_PROVIDER_SITE_OTHER): Payer: Medicare Other | Admitting: Cardiothoracic Surgery

## 2011-02-23 DIAGNOSIS — D150 Benign neoplasm of thymus: Secondary | ICD-10-CM

## 2011-02-23 DIAGNOSIS — R222 Localized swelling, mass and lump, trunk: Secondary | ICD-10-CM

## 2011-02-23 NOTE — Assessment & Plan Note (Signed)
OFFICE VISIT  [name], [name] DOB:  1941-10-02                                        February 23, 2011 CHART #:  [id number]  HISTORY:  The patient returns to the office today in followup after the resection of his mediastinal tumor.  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  At the time of resection, we did not find any evidence of invasion.  The patient has done extremely well postoperatively without much discomfort. Since surgery, his medical problems have revolved around urologic problems which he recently was seen in neurology clinic and also presenting with pancreatitis in March and underwent a cholecystectomy by Dr. Georgette Dover.  PHYSICAL EXAMINATION:  Today, his blood pressure 125/79, pulse 81, respiratory rate 16, O2 sats 95%.  His sternum is stable and well healed.  His lungs are clear.  I do not appreciate any cervical or supraclavicular adenopathy.  His laparoscopic cholecystectomy incisions are well healed.  He has no calf tenderness or swelling.  IMAGING:  Followup chest x-ray shows stable chest x-ray with no active disease.  Mediastinal contours appear stable.  IMPRESSION AND PLAN:  Overall, I am very pleased with his progression postoperatively.  I have again reviewed with him the pathology as there was some confusion preoperatively on the needle biopsy of a diagnosis of synovial sarcoma and the final diagnosis of type AB thymoma, although at the time of surgery there was no evidence of malignancy.  We will follow him closely as there was some controversy about the diagnosis.  His chest x- ray today is clear of any mediastinal changes.  We will await 4-5 months and repeat a followup CT scan.  Lanelle Bal, MD Electronically Signed  EG/MEDQ  D:  02/23/2011  T:  02/23/2011  Job:  [id number]  cc:   Lilian Coma, MD

## 2011-03-14 NOTE — Letter (Signed)
October 11, 2010   Sherryl Manges, MD  [place], [place] [address]   Re:  HULET, EHRMANN              DOB:  05/09/1941   Dear Trudee Grip:   I appreciate the opportunity of seeing the patient.  Dr. Isidore Moos referred  him here.  This 70 year old [place] gentleman, was admitted to the  hospital in September and who was found to have a right cardiophrenic  mass that measured 7.3 x 6.5 x 6.1.  A biopsy was done and revealed a  synovial sarcoma as a differential diagnosis with rare mitotic figures.  This was sent off for consultation to Dr. Kris Mouton at [facility].  At  that time, he was in the hospital with pancreatitis and had bilateral  effusions and some mild pericardial effusions, which have all since  resolved as he recovered from the acute pancreatitis.  His CAT scan was  positive with a standard uptake value of 3.1.  He has had a 10- to 12-  pound weight loss in September and has some chronic abdominal pain, he  takes OxyContin twice a day for this.  His pancreatitis was thought to  be secondary to gallstones and Dr. Donnie Mesa is following him for  this.   PAST MEDICAL HISTORY:  Significant for hypertension, hyperlipidemia, and  he has had left tibia surgery and BPH.   MEDICATIONS:  1. OxyContin 15 mg twice a day.  2. Proscar 5 mg daily.  3. Lisinopril/hydrochlorothiazide 20/12.5 daily.  4. Lovastatin 40 mg daily.   ALLERGIES:  He has no allergies.   FAMILY HISTORY:  Positive for lung cancer.   SOCIAL HISTORY:  He is a [place], migrated here of 25 years ago,  is retired.  He was in the [place] and fought in [place] in the  1960s and had [place] exposure.  He quit smoking in 1990.  Does not  drink alcohol on a regular basis.   REVIEW OF SYSTEMS:  CARDIAC:  See history of present illness.  PULMONARY:  See history of present illness.  GI:  See history of present illness.  He has some constipation.  GU:  He has got dysuria and BPH.  VASCULAR:  No claudication,  DVT, TIAs.  NEUROLOGIC:  No dizziness, headaches, blackouts, or seizures.  MUSCULOSKELETAL:  No arthritis.  PSYCHIATRIC:  No depression or nervousness.  EYE/ENT:  No changes in eyesight or hearing.  NEUROLOGIC:  No problems with bleeding, clotting disorders, or anemia.   PHYSICAL EXAMINATION:  General:  He is a well-developed [place] male in  no acute distress.  Head, Eyes, Ears, Nose, and Throat:  Unremarkable.  Neck:  Supple without thyromegaly.  There is no supraclavicular or  axillary adenopathy.  Chest:  Clear to auscultation and percussion.  Heart:  Regular sinus rhythm.  No murmurs.  Abdomen:  Soft.  There is no  hepatosplenomegaly.  Extremities:  Pulses are 2+.  There is no clubbing  or edema.  Neurologic:  He is oriented x3.  Sensory and motor intact.  Cranial nerves are intact.   ASSESSMENT AND PLAN:  I have discussed the risk of the procedure with  him and I think we will proceed with resection on October 17, 2010, at  [facility].  We will need to do a median sternotomy with probably a  pericardial resection.  I appreciate the opportunity of seeing the  patient.   Sincerely,   Nicanor Alcon,  M.D.  Electronically Signed   DPB/MEDQ  D:  10/11/2010  T:  10/12/2010  Job:  [id number]   cc:   Floyce Stakes, MD

## 2011-03-14 NOTE — Assessment & Plan Note (Signed)
OFFICE VISIT   TURKI, TAPANES  DOB:  1941/05/20                                        November 10, 2010  CHART #:  [id number]   The patient returns to the office today in followup after his recent  sternotomy and resection of right mediastinal mass on preop biopsy was  suggested to be a synovial sarcoma.  The patient tolerated the procedure  very well and is making good progress at home, returning to near normal  activities.  He has been avoiding any heavy lifting.  He denies any  significant shortness of breath.   On exam today; his blood pressure 126/79, pulse 96, respiratory rate is  18, O2 sats 93% on room air.  Sternum is stable and well healed.  His  lungs are clear bilaterally.  I do not appreciate any cervical or  supraclavicular adenopathy.  Follow up chest x-ray, his chest x-ray  shows clear lung fields without effusions bilaterally.   Follow up pathology on the entire resected mass shows a type AB thymoma  without evidence of capsular invasion with this diagnosis different than  what was thought.  The patient does not need radiation or chemotherapy,  but just observation.  He does have appointment to see Dr. Eppie Gibson  to discuss radiation next week.  Otherwise, which I do not think he  needs.  I will see him back in 3 months with a followup chest x-ray and  then again in 6 months after he has a CT scan that has been arranged by  Dr. Lamonte Sakai.   Lanelle Bal, MD  Electronically Signed   EG/MEDQ  D:  11/10/2010  T:  11/11/2010  Job:  [id number]   cc:   Lilian Coma, MD  Rico Ala, MD

## 2011-03-17 NOTE — Op Note (Signed)
NAMEKEYLOR, [name]                  ACCOUNT NO.:  [id number]   MEDICAL RECORD NO.:  [id number]          PATIENT TYPE:  AMB   LOCATION:  DSC                          FACILITY:  [facility]   PHYSICIAN:  Leonides Grills, M.D.     DATE OF BIRTH:  [birth year]   DATE OF PROCEDURE:  08/08/2006  DATE OF DISCHARGE:                                 OPERATIVE REPORT   PREOPERATIVE DIAGNOSIS:  Left tibial nonunion.   POSTOPERATIVE DIAGNOSIS:  Left tibial nonunion.   OPERATIONS:  1. Left intramedullary nailing, tibial nonunion.  2. Left fibular osteotomy.  3. Stress x-rays, left leg.   SURGEON:  Leonides Grills, M.D.   ASSISTANT:  Evlyn Kanner, P.A.-C.   ANESTHESIA:  General.   ESTIMATED BLOOD LOSS:  Minimal.   TOURNIQUET:  None.   COMPLICATIONS:  None.   DISPOSITION:  Stable to the PAR.   INDICATIONS:  This is a 70 year old male, who sustained an injury while at  work, and went on to a nonunion after having this treated conservatively.  He was consented for the above procedure.  All risks, which include  infection, neurovascular injury, nonunion, malunion, hardware rotation,  hardware failure, persistent pain, worse pain, prolonged recovery,  stiffness, arthritis, and knee pain and ankle pain, were all explained.  Questions were encouraged and answered.   DESCRIPTION OF OPERATIONS:  The patient was brought to the operating room,  placed in supine position.  Initially, after adequate general endotracheal  tube anesthesia was administered, as well as Ancef 1 g IV piggyback, a bump  was placed under the left ipsilateral hip, internally rotating the left  lower extremity, and the left lower extremity was prepped and draped in a  sterile manner.  A longitudinal incision over the medial aspect of the  patellar tendon was then made.  Dissection was carried down through the  skin. Hemostasis was obtained.  The retinaculum was opened medial to the  patellar tendon, and dissection was carried down to  bone, and the anterior  edge of the proximal tibia was palpated.  An awl was then placed under C-arm  guidance in the A/P and lateral planes, and a K wire was then passed.  The  awl was then removed.  The K wire was passed across the fracture site into a  central position in both the A/P and lateral planes.  We then commenced  reaming.  We reamed sequentially from 8 all the way to 13.  We measured this  and it was a 30-cm nail.  We then chose a 12 x 30-cm DePuy tibial nail.  This was then passed without difficulty, and, again, it was visualized in  the A/P and lateral planes to be in excellent position.  We then placed a  dynamization screw proximally, 1 screw, and then through a free-handed  technique, placed 1 distal screw.  This was 40 mm in length.  Final x-rays  were obtained in the A/P and lateral planes.  Stress x-rays then showed no  gross motion of the fracture site, fixation in proper position as well.  We  then made a longitudinal incision over the lateral aspect of the fibula.  Dissection was carried down through the skin.  Hemostasis was obtained.  Blunt dissection was taken through the muscle to the fibula.  Hohmanns were  then placed.  An oblique fibular osteotomy was then made and a centimeter of  bone was then removed.  The area was copiously irrigated with normal saline.  There was no pulsatile bleeding, obviously, and the retinaculum was closed  with 0 Vicryl suture.  Subcu was closed with 3-0 Vicryl suture.  The skin  was closed with 4-0 nylon suture over all wounds.  Sterile dressing was  applied.  A modified Jones dressing was applied.  The patient was stable to  the PAR.      Leonides Grills, M.D.  Electronically Signed     PB/MEDQ  D:  08/08/2006  T:  08/08/2006  Job:  [id number]

## 2011-04-27 ENCOUNTER — Other Ambulatory Visit (HOSPITAL_COMMUNITY): Payer: Self-pay

## 2011-04-27 ENCOUNTER — Ambulatory Visit (HOSPITAL_COMMUNITY)
Admission: RE | Admit: 2011-04-27 | Discharge: 2011-04-27 | Disposition: A | Discharge status: Home / Self Care | Payer: Medicare Other | Source: Ambulatory Visit | Attending: Oncology | Admitting: Oncology

## 2011-04-27 ENCOUNTER — Encounter (HOSPITAL_COMMUNITY): Payer: Self-pay

## 2011-04-27 ENCOUNTER — Encounter (HOSPITAL_BASED_OUTPATIENT_CLINIC_OR_DEPARTMENT_OTHER): Payer: Medicare Other | Admitting: Oncology

## 2011-04-27 ENCOUNTER — Other Ambulatory Visit: Payer: Self-pay | Admitting: Oncology

## 2011-04-27 DIAGNOSIS — I2510 Atherosclerotic heart disease of native coronary artery without angina pectoris: Secondary | ICD-10-CM | POA: Insufficient documentation

## 2011-04-27 DIAGNOSIS — C37 Malignant neoplasm of thymus: Secondary | ICD-10-CM | POA: Insufficient documentation

## 2011-04-27 DIAGNOSIS — C3490 Malignant neoplasm of unspecified part of unspecified bronchus or lung: Secondary | ICD-10-CM

## 2011-04-27 DIAGNOSIS — E041 Nontoxic single thyroid nodule: Secondary | ICD-10-CM | POA: Insufficient documentation

## 2011-04-27 DIAGNOSIS — K859 Acute pancreatitis without necrosis or infection, unspecified: Secondary | ICD-10-CM | POA: Insufficient documentation

## 2011-04-27 DIAGNOSIS — M47812 Spondylosis without myelopathy or radiculopathy, cervical region: Secondary | ICD-10-CM | POA: Insufficient documentation

## 2011-04-27 LAB — CMP (CANCER CENTER ONLY)
AST: 23 U/L (ref 11–38)
Albumin: 3.9 g/dL (ref 3.3–5.5)
Alkaline Phosphatase: 69 U/L (ref 26–84)
Glucose, Bld: 113 mg/dL (ref 73–118)
Potassium: 4 mEq/L (ref 3.3–4.7)
Sodium: 141 mEq/L (ref 128–145)
Total Protein: 7.4 g/dL (ref 6.4–8.1)

## 2011-04-27 LAB — CBC WITH DIFFERENTIAL/PLATELET
EOS%: 4.1 % (ref 0.0–7.0)
Eosinophils Absolute: 0.2 10*3/uL (ref 0.0–0.5)
MCV: 96.7 fL (ref 79.3–98.0)
MONO%: 9.6 % (ref 0.0–14.0)
NEUT#: 2.6 10*3/uL (ref 1.5–6.5)
RBC: 4.54 10*6/uL (ref 4.20–5.82)
RDW: 14.1 % (ref 11.0–14.6)
lymph#: 2 10*3/uL (ref 0.9–3.3)

## 2011-04-27 MED ORDER — IOHEXOL 300 MG/ML  SOLN
100.0000 mL | Freq: Once | INTRAMUSCULAR | Status: AC | PRN
  Administered 2011-04-27: 100 mL via INTRAVENOUS

## 2011-04-28 ENCOUNTER — Encounter (HOSPITAL_BASED_OUTPATIENT_CLINIC_OR_DEPARTMENT_OTHER): Payer: Medicare Other | Admitting: Oncology

## 2011-04-28 DIAGNOSIS — C37 Malignant neoplasm of thymus: Secondary | ICD-10-CM

## 2011-05-18 ENCOUNTER — Ambulatory Visit (INDEPENDENT_AMBULATORY_CARE_PROVIDER_SITE_OTHER): Payer: Medicare Other | Admitting: Cardiothoracic Surgery

## 2011-05-18 ENCOUNTER — Ambulatory Visit: Payer: Self-pay | Admitting: Cardiothoracic Surgery

## 2011-05-18 VITALS — BP 109/72 | HR 80 | Resp 16

## 2011-05-18 DIAGNOSIS — Z09 Encounter for follow-up examination after completed treatment for conditions other than malignant neoplasm: Secondary | ICD-10-CM

## 2011-05-18 DIAGNOSIS — D150 Benign neoplasm of thymus: Secondary | ICD-10-CM

## 2011-05-18 NOTE — Assessment & Plan Note (Signed)
OFFICE VISIT  HOLTON, SIDMAN DOB:  1941/04/05                                        May 18, 2011 CHART #:  [id number]  The patient returns to the office today in followup after his resection of a 7 x 5.5 x 5-cm mediastinal mass.  The final diagnosis was found to be a thymoma without evidence of capsular invasion or evidence of malignancy.  At that time, the resection we did not find any evidence of invasion and the mass was easily resected.  Subsequently, he underwent cholecystectomy by Dr. Fannie Knee, in the spring of this year.  On April 27, 2011, a followup CT scan was performed by Dr. Luciano Cutter, which showed no evidence of residual recurrent chest mass.  The pancreas was incompletely imaged, suggestive of pancreatitis or possible underlying adenocarcinoma could have a similar look according to the CT report.  On exam, the patient blood pressure is 109/72, pulse 80, respiratory rate 18, and O2 sats 95%.  Sternum stable and well healed.  Lungs are clear bilaterally.  The abdominal exam was well without palpable masses or tenderness.  Lower extremities are without any edema.  The patient's CT of the chest is reviewed and agree with the reading, there is no evidence of recurrent mediastinal tumor.  I will plan to see the patient back again in 6 months with a followup chest x-ray.  For his review of the CT scan done after cholecystectomy whether he thinks any further imaging should be carried out to evaluate the pancreas.  Sheliah Plane, MD Electronically Signed  EG/MEDQ  D:  05/18/2011  T:  05/18/2011  Job:  [id number]  cc:   Wilmon Arms. Tsuei, M.D. Emeterio Reeve, MD Exie Parody, M.D.

## 2011-05-24 NOTE — Progress Notes (Signed)
These findings likely represent the residual effects of his severe pancreatitis.  He has been thoroughly imaged in the past and I don't believe that any further imaging will be beneficial at this time.

## 2011-08-26 IMAGING — CR DG ABDOMEN 1V
1 series · 1 of 1 positions shown · non-contrast
Comparison: 07/18/2010

CLINICAL DATA: Pancreatitis.  Abdominal pain and ileus.

ABDOMEN - 1 VIEW

[t abdomen supine]
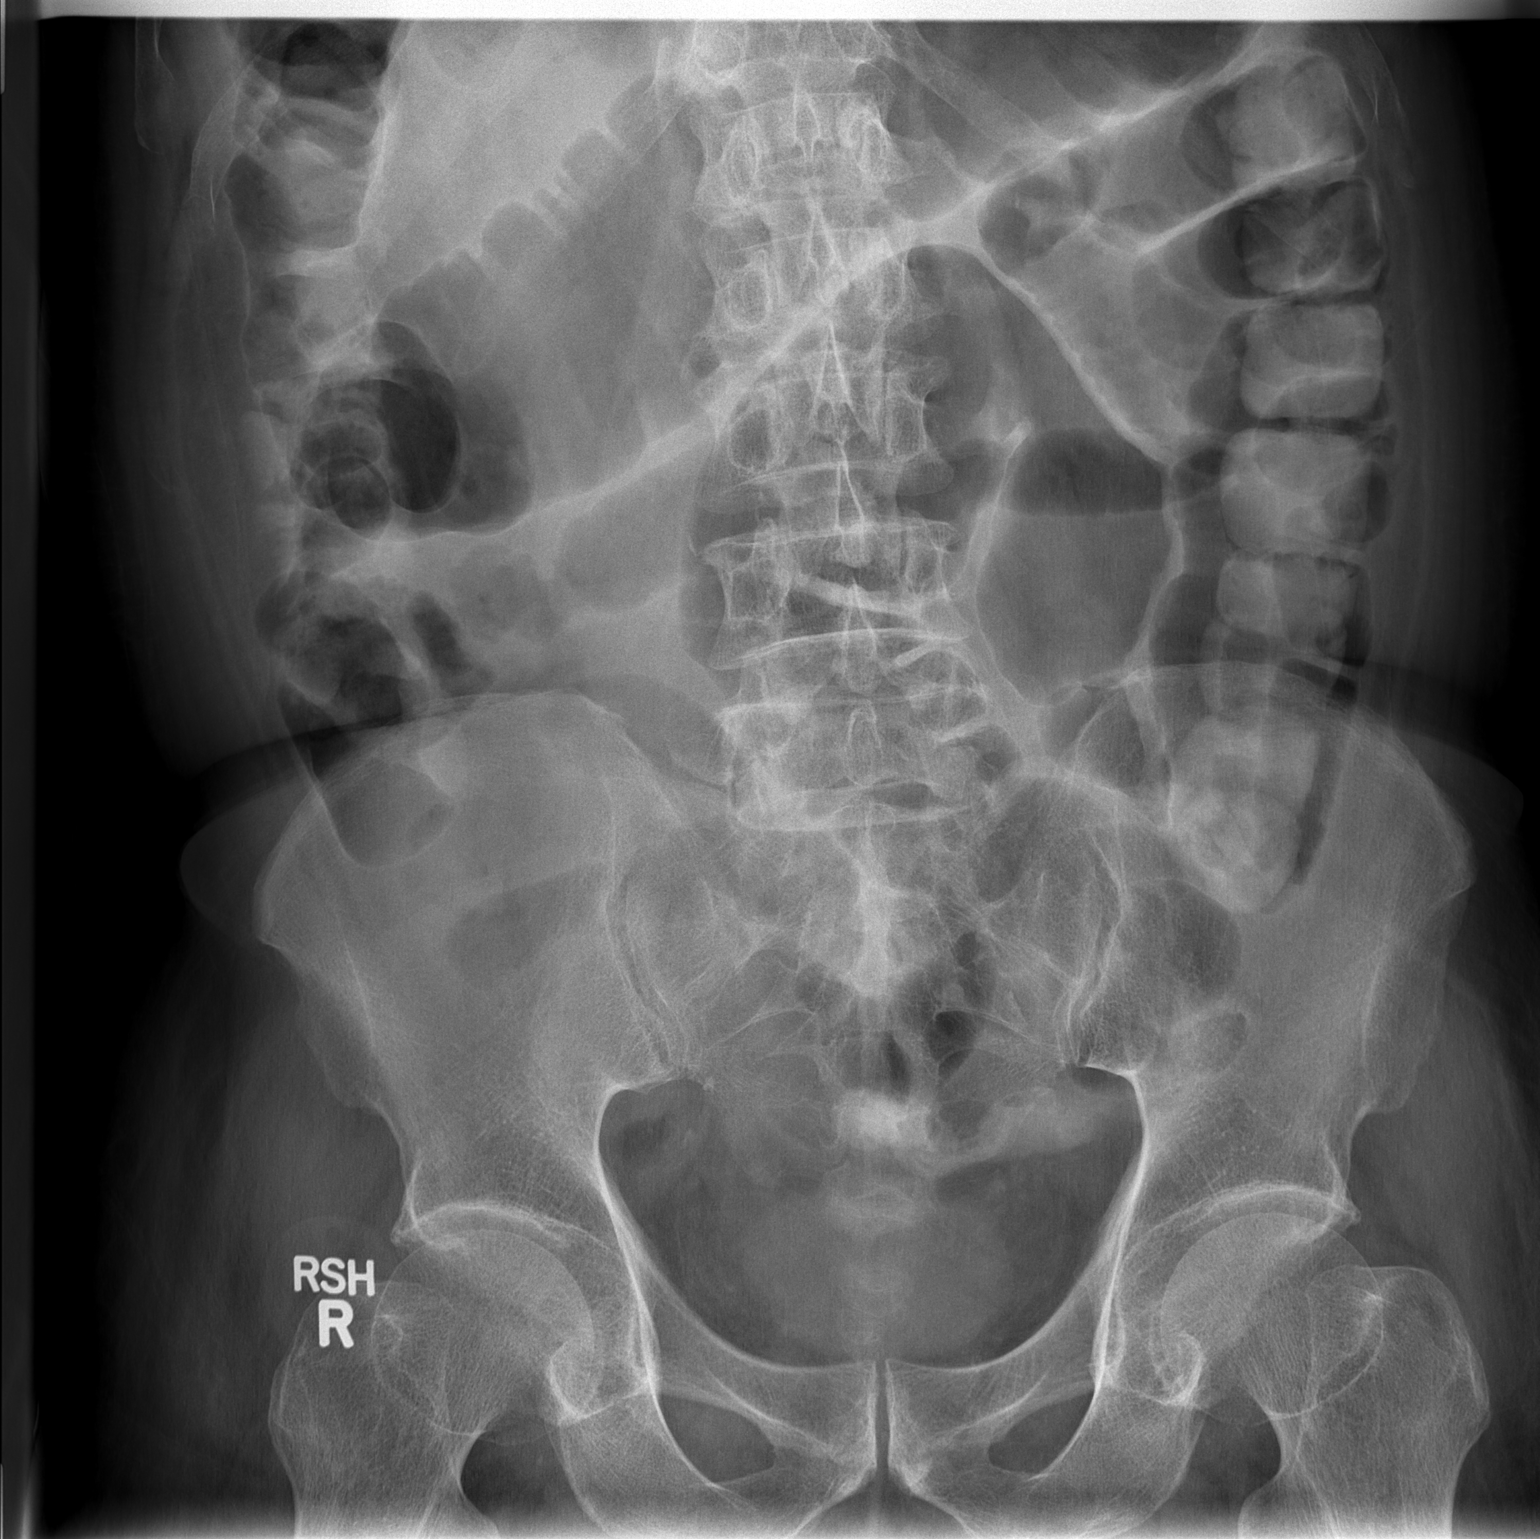

[1 of 1 positions shown; findings below may reference images not displayed]

FINDINGS: Contrast has moved into the left side of the colon.
There is less air in the large and small bowel.

No other abnormalities.
IMPRESSION: Improving  ileus.

## 2011-08-30 IMAGING — CR DG CHEST 1V PORT
1 series · 1 of 1 positions shown · non-contrast
Comparison: 07/20/2010

CLINICAL DATA: PICC line placement

PORTABLE CHEST - 1 VIEW

[AP]
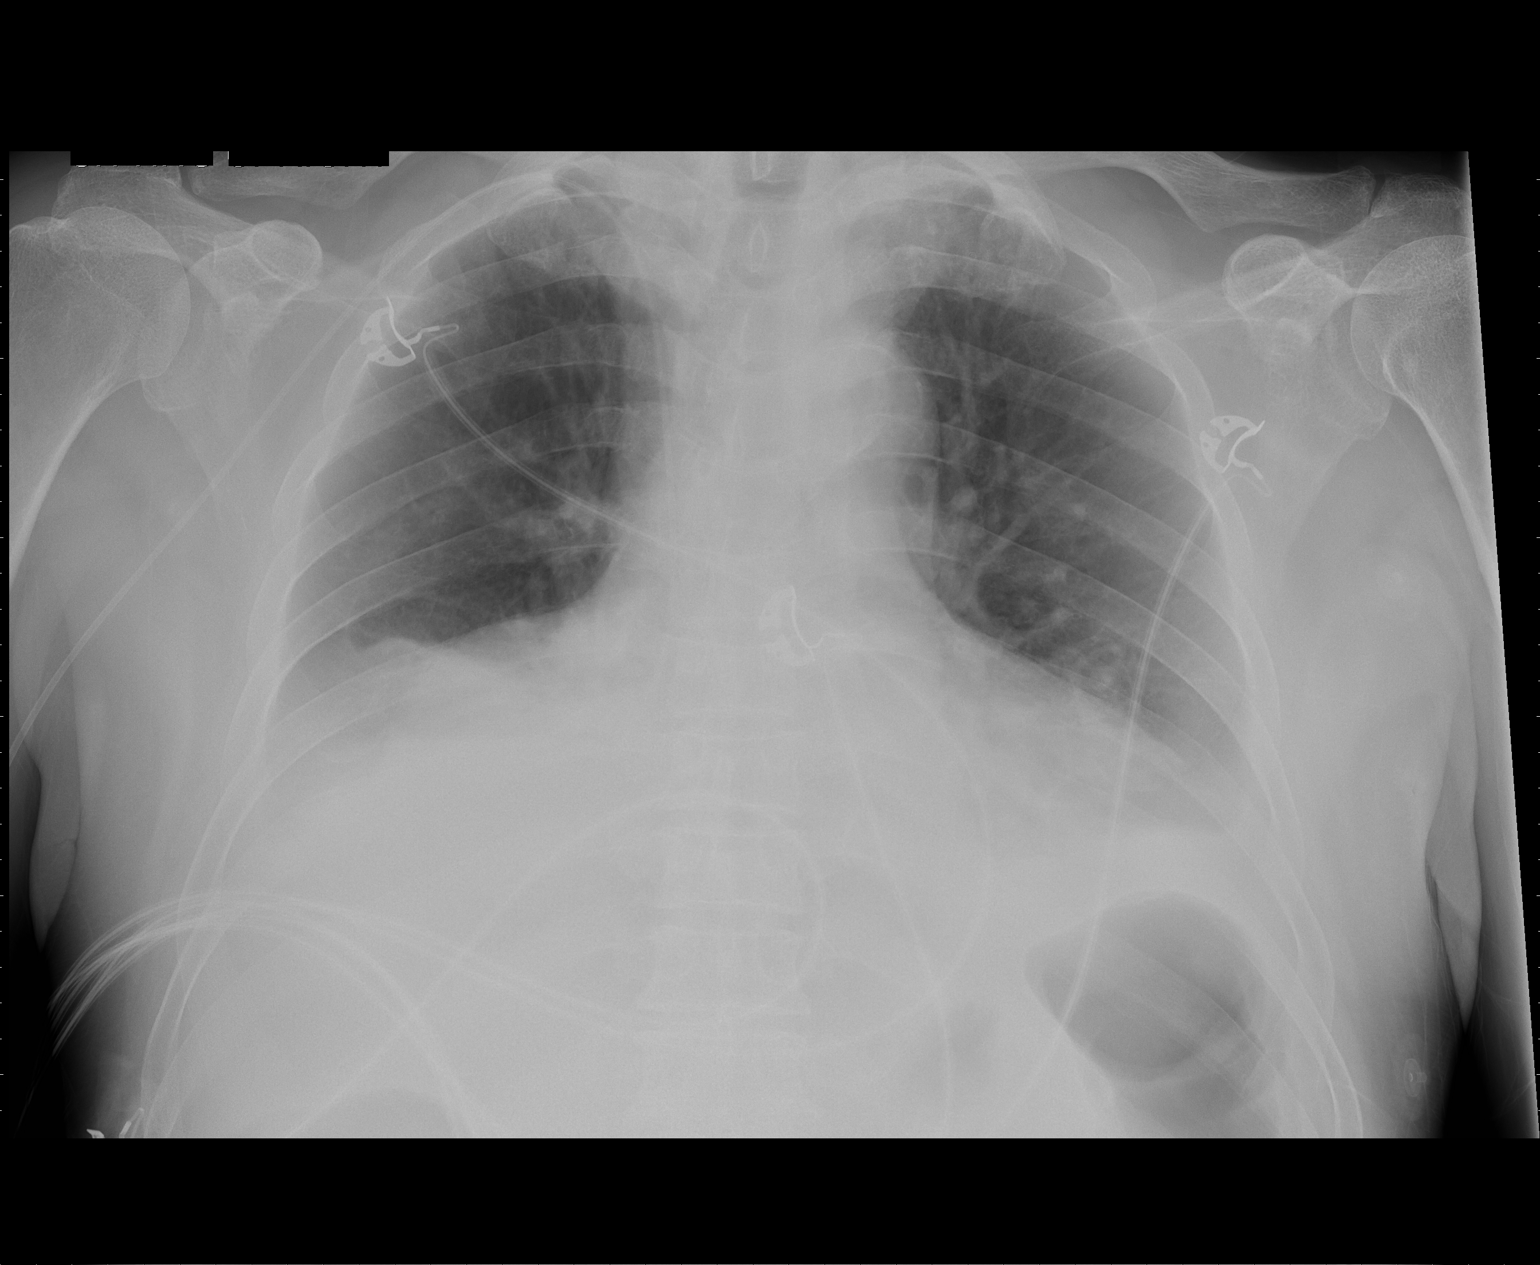

[1 of 1 positions shown; findings below may reference images not displayed]

FINDINGS: 8658 hours.  Low lung volumes are low.  There is
asymmetric elevation of the right hemidiaphragm.  Lung aeration is
improved although there is persistent bibasilar atelectasis or
infiltrate with small bilateral pleural effusions.  Right PICC line
is new in the interval.  The catheter tracks at least as far as the
distal SVC, but above tip is not clearly discriminated which may be
secondary to motion of the catheter tip secondary to cardiac
movement.
IMPRESSION: Right PICC line tip tracks as far as the distal SVC, but the tip
cannot be discretely visualized on this film.  Repeat x-ray is
recommended for definitive characterization of tip placement.

## 2011-09-24 ENCOUNTER — Encounter: Payer: Self-pay | Admitting: Oncology

## 2011-09-24 DIAGNOSIS — E785 Hyperlipidemia, unspecified: Secondary | ICD-10-CM | POA: Insufficient documentation

## 2011-09-24 DIAGNOSIS — K859 Acute pancreatitis without necrosis or infection, unspecified: Secondary | ICD-10-CM | POA: Insufficient documentation

## 2011-09-24 DIAGNOSIS — I10 Essential (primary) hypertension: Secondary | ICD-10-CM | POA: Insufficient documentation

## 2011-09-25 ENCOUNTER — Encounter: Payer: Self-pay | Admitting: *Deleted

## 2011-09-28 ENCOUNTER — Other Ambulatory Visit (HOSPITAL_BASED_OUTPATIENT_CLINIC_OR_DEPARTMENT_OTHER): Payer: Medicare Other | Admitting: Lab

## 2011-09-28 ENCOUNTER — Ambulatory Visit (HOSPITAL_COMMUNITY)
Admission: RE | Admit: 2011-09-28 | Discharge: 2011-09-28 | Disposition: A | Discharge status: Home / Self Care | Payer: Medicare Other | Source: Ambulatory Visit | Attending: Oncology | Admitting: Oncology

## 2011-09-28 ENCOUNTER — Ambulatory Visit (HOSPITAL_BASED_OUTPATIENT_CLINIC_OR_DEPARTMENT_OTHER): Payer: Medicare Other | Admitting: Oncology

## 2011-09-28 ENCOUNTER — Other Ambulatory Visit: Payer: Self-pay | Admitting: Oncology

## 2011-09-28 DIAGNOSIS — C37 Malignant neoplasm of thymus: Secondary | ICD-10-CM

## 2011-09-28 DIAGNOSIS — E785 Hyperlipidemia, unspecified: Secondary | ICD-10-CM

## 2011-09-28 DIAGNOSIS — X58XXXA Exposure to other specified factors, initial encounter: Secondary | ICD-10-CM | POA: Insufficient documentation

## 2011-09-28 DIAGNOSIS — C493 Malignant neoplasm of connective and soft tissue of thorax: Secondary | ICD-10-CM

## 2011-09-28 DIAGNOSIS — S22009A Unspecified fracture of unspecified thoracic vertebra, initial encounter for closed fracture: Secondary | ICD-10-CM | POA: Insufficient documentation

## 2011-09-28 DIAGNOSIS — I10 Essential (primary) hypertension: Secondary | ICD-10-CM

## 2011-09-28 DIAGNOSIS — K859 Acute pancreatitis without necrosis or infection, unspecified: Secondary | ICD-10-CM

## 2011-09-28 DIAGNOSIS — D150 Benign neoplasm of thymus: Secondary | ICD-10-CM | POA: Insufficient documentation

## 2011-09-28 LAB — COMPREHENSIVE METABOLIC PANEL
ALT: 25 U/L (ref 0–53)
Albumin: 4.6 g/dL (ref 3.5–5.2)
CO2: 28 mEq/L (ref 19–32)
Calcium: 9.6 mg/dL (ref 8.4–10.5)
Chloride: 104 mEq/L (ref 96–112)
Potassium: 4.5 mEq/L (ref 3.5–5.3)
Sodium: 140 mEq/L (ref 135–145)
Total Protein: 7 g/dL (ref 6.0–8.3)

## 2011-09-28 LAB — CBC WITH DIFFERENTIAL/PLATELET
EOS%: 4.3 % (ref 0.0–7.0)
MCH: 33.5 pg — ABNORMAL HIGH (ref 27.2–33.4)
MCV: 97.3 fL (ref 79.3–98.0)
MONO%: 8.9 % (ref 0.0–14.0)
RBC: 4.71 10*6/uL (ref 4.20–5.82)
RDW: 13.3 % (ref 11.0–14.6)

## 2011-09-28 NOTE — Progress Notes (Signed)
[facility] OFFICE PROGRESS NOTE  Christopher Hai, MD, MD  DIAGNOSIS:  Type AB thymoma with no evidence of capsular invasion and no evidence of malignancy, measured 7 x 5.5 x 5.0 cm.  PAST THERAPY:  He underwent, on October 17, 2010, sternotomy and excision of mediastinal mass.  CURRENT THERAPY:  Watchful observation.     INTERVAL HISTORY: [name] 70 y.o. male returns for regular follow up.  For the past 2 months, he has been having intermittent, moderate to severe mediastinal chest wall pain.  This pain is not related to eating, activity.  Pain last for a few minutes and resolved spontaneously.  Pain is not associated with SOB, dizziness, radiating, palpitation.  He has had good appetite and normal weight.   Patient denies fatigue, headache, visual changes, confusion, drenching night sweats, palpable lymph node swelling, mucositis, odynophagia, dysphagia, nausea vomiting, jaundice, chest pain, palpitation, shortness of breath, dyspnea on exertion, productive cough, gum bleeding, epistaxis, hematemesis, hemoptysis, abdominal pain, abdominal swelling, early satiety, melena, hematochezia, hematuria, skin rash, spontaneous bleeding, joint swelling, joint pain, heat or cold intolerance, bowel bladder incontinence, back pain, focal motor weakness, paresthesia, depression, suicidal or homocidal ideation, feeling hopelessness.   MEDICAL HISTORY: Past Medical History  Diagnosis Date  . Thymoma, malignant   . Hypertension   . Hyperlipidemia   . Pancreatitis     SURGICAL HISTORY:  Past Surgical History  Procedure Date  . Sternotomy 08/2010    for thymoma resection    MEDICATIONS: Current Outpatient Prescriptions  Medication Sig Dispense Refill  . finasteride (PROSCAR) 5 MG tablet Take 5 mg by mouth daily.        Marland Kitchen lisinopril-hydrochlorothiazide (PRINZIDE,ZESTORETIC) 20-12.5 MG per tablet Take 1 tablet by mouth daily.        Marland Kitchen lovastatin (MEVACOR) 40 MG tablet Take 40 mg  by mouth at bedtime.          ALLERGIES:   has no known allergies.  REVIEW OF SYSTEMS:  The rest of the 14-point review of system was negative.   Filed Vitals:   09/28/11 0937  BP: 125/81  Pulse: 101  Temp: 99.1 F (37.3 C)   Wt Readings from Last 3 Encounters:  09/28/11 170 lb 12.8 oz (77.474 kg)  04/28/11 161 lb 8 oz (73.256 kg)   ECOG Performance status: 0  PHYSICAL EXAMINATION:   General:  well-nourished in no acute distress.  Eyes:  no scleral icterus.  ENT:  There were no oropharyngeal lesions.  Neck was without thyromegaly.  Lymphatics:  Negative cervical, supraclavicular or axillary adenopathy.  Respiratory: lungs were clear bilaterally without wheezing or crackles.  Cardiovascular:  Regular rate and rhythm, S1/S2, without murmur, rub or gallop.  There was no pedal edema.  GI:  abdomen was soft, flat, nontender, nondistended, without organomegaly.  Muscoloskeletal:  no spinal tenderness of palpation of vertebral spine.  There was no palpable mass; pain or skin rash over the sternum.  Skin exam was without echymosis, petichae.  Neuro exam was nonfocal.  Patient was able to get on and off exam table without assistance.  Gait was normal.  Patient was alerted and oriented.  Attention was good.   Language was appropriate.  Mood was normal without depression.  Speech was not pressured.  Thought content was not tangential.      LABORATORY/RADIOLOGY DATA:  Lab Results  Component Value Date   WBC 6.1 09/28/2011   HGB 15.8 09/28/2011   HCT 45.9 09/28/2011   PLT 241 09/28/2011  GLUCOSE 93 09/28/2011   GLUCOSE 93 09/28/2011   CHOL  Value: 113 (NOTE) ATP III Classification:      < 200        mg/dL        Desirable     161 - 239     mg/dL        Borderline High     >= 240        mg/dL        High  09/60/4540   TRIG 130 08/08/2010   ALT 25 09/28/2011   ALT 25 09/28/2011   AST 22 09/28/2011   AST 22 09/28/2011   NA 140 09/28/2011   NA 140 09/28/2011   K 4.5 09/28/2011   K 4.5  09/28/2011   CL 104 09/28/2011   CL 104 09/28/2011   CREATININE 0.99 09/28/2011   CREATININE 0.99 09/28/2011   BUN 19 09/28/2011   BUN 19 09/28/2011   CO2 28 09/28/2011   CO2 28 09/28/2011   INR 0.99 10/13/2010    Dg Chest 2 View  09/28/2011  *RADIOLOGY REPORT*  Clinical Data: Thymoma  CHEST - 2 VIEW  Comparison: CT chest 04/27/2011, chest x-ray 02/23/2011  Findings: Median sternotomy with clips in the anterior mediastinum from prior   tumor resection.  No evidence of recurrent mass lesion. Scarring in the right cardiophrenic angle is unchanged from the prior study.  Lungs are clear without infiltrate or effusion.  No lung nodule is present.  Mild compression fracture of T12 is unchanged.  IMPRESSION: Postop changes from   tumor  resection.  No recurrent mass is identified.  Original Report Authenticated By: Camelia Phenes, M.D.    ASSESSMENT AND PLAN:  1.  History of thymoma status post resection: The chest wall pain is most likely fibrosis from surgery.  Even though CXR was negative; pretest probability for recurrent disease is medium.  I went ahead and ordered CT chest within the next few weeks to rule out recurrent disease.  The character of the pain was most likely not related to GERD or cardiac; however, I appreciated PCP's evaluation of patient to make sure that other potential causes are ruled out.  2. Hypertension, well controlled on lisinopril/hydrochlorothiazide per primary care physician.  3. Hyperlipidemia.  He is on lovastatin per primary care physician.   4. History of pancreatitis:   He no longer has any current abdominal pain.  He is status post cholecystectomy.  I advised him if he has any abdominal pain, nausea, vomiting, or severe weight loss, he needs to let us know for further workup.   5. Follow up with me in about 6 months.  I advised him to let us know if the chest wall pain persists.

## 2011-10-06 ENCOUNTER — Telehealth: Payer: Self-pay | Admitting: *Deleted

## 2011-10-06 ENCOUNTER — Ambulatory Visit (HOSPITAL_COMMUNITY)
Admission: RE | Admit: 2011-10-06 | Discharge: 2011-10-06 | Disposition: A | Discharge status: Home / Self Care | Payer: Medicare Other | Source: Ambulatory Visit | Attending: Oncology | Admitting: Oncology

## 2011-10-06 ENCOUNTER — Encounter (HOSPITAL_COMMUNITY): Payer: Self-pay

## 2011-10-06 DIAGNOSIS — R079 Chest pain, unspecified: Secondary | ICD-10-CM | POA: Insufficient documentation

## 2011-10-06 DIAGNOSIS — C37 Malignant neoplasm of thymus: Secondary | ICD-10-CM | POA: Insufficient documentation

## 2011-10-06 DIAGNOSIS — K8689 Other specified diseases of pancreas: Secondary | ICD-10-CM | POA: Insufficient documentation

## 2011-10-06 DIAGNOSIS — K7689 Other specified diseases of liver: Secondary | ICD-10-CM | POA: Insufficient documentation

## 2011-10-06 DIAGNOSIS — I2510 Atherosclerotic heart disease of native coronary artery without angina pectoris: Secondary | ICD-10-CM | POA: Insufficient documentation

## 2011-10-06 DIAGNOSIS — I517 Cardiomegaly: Secondary | ICD-10-CM | POA: Insufficient documentation

## 2011-10-06 DIAGNOSIS — Z9089 Acquired absence of other organs: Secondary | ICD-10-CM | POA: Insufficient documentation

## 2011-10-06 DIAGNOSIS — J438 Other emphysema: Secondary | ICD-10-CM | POA: Insufficient documentation

## 2011-10-06 MED ORDER — IOHEXOL 300 MG/ML  SOLN
80.0000 mL | Freq: Once | INTRAMUSCULAR | Status: AC | PRN
  Administered 2011-10-06: 80 mL via INTRAVENOUS

## 2011-10-06 NOTE — Telephone Encounter (Signed)
Message copied by Wende Mott on Fri Oct 06, 2011  5:04 PM ------      Message from: Jethro Bolus T      Created: Fri Oct 06, 2011  4:40 PM       Please call pt and let him know that his chest ct was ok.  Nothing to explain his chest wall pain.  There was no sign of recurrent cancer.   If pain persists, he can take some Ibuprofen 200 mg po q12 hr prn.  If there is pain with exertion, he must call his PCP for evaluation.

## 2011-10-06 NOTE — Telephone Encounter (Signed)
Called pt to relay message below from Dr. Gaylyn Rong.  Pt verbalized understanding of directions and denies any questions or new problems.

## 2011-11-22 ENCOUNTER — Other Ambulatory Visit: Payer: Self-pay | Admitting: Cardiothoracic Surgery

## 2011-11-22 DIAGNOSIS — D150 Benign neoplasm of thymus: Secondary | ICD-10-CM

## 2011-11-23 ENCOUNTER — Ambulatory Visit: Payer: Medicare Other | Admitting: Cardiothoracic Surgery

## 2011-12-15 ENCOUNTER — Other Ambulatory Visit: Payer: Self-pay | Admitting: Cardiothoracic Surgery

## 2011-12-15 DIAGNOSIS — D150 Benign neoplasm of thymus: Secondary | ICD-10-CM

## 2011-12-21 ENCOUNTER — Ambulatory Visit (INDEPENDENT_AMBULATORY_CARE_PROVIDER_SITE_OTHER): Payer: Medicare Other | Admitting: Cardiothoracic Surgery

## 2011-12-21 ENCOUNTER — Encounter: Payer: Self-pay | Admitting: Cardiothoracic Surgery

## 2011-12-21 ENCOUNTER — Ambulatory Visit
Admission: RE | Admit: 2011-12-21 | Discharge: 2011-12-21 | Disposition: A | Discharge status: Home / Self Care | Payer: Medicare Other | Source: Ambulatory Visit | Attending: Cardiothoracic Surgery | Admitting: Cardiothoracic Surgery

## 2011-12-21 VITALS — BP 137/81 | HR 92 | Resp 18 | Ht 68.0 in | Wt 168.0 lb

## 2011-12-21 DIAGNOSIS — D150 Benign neoplasm of thymus: Secondary | ICD-10-CM

## 2011-12-21 DIAGNOSIS — Z09 Encounter for follow-up examination after completed treatment for conditions other than malignant neoplasm: Secondary | ICD-10-CM

## 2011-12-21 NOTE — Progress Notes (Signed)
[address]            [place] [address]          [phone]       Wilber Fini [facility] Medical Record #[id number] Date of Birth: August 21, 1941  Billy Fischer, MD, MD  Chief Complaint:   PostOp Follow Up Visit   History of Present Illness:     Patient returns for followup visit after resection of a 7 x 5.5 x 5 cm thymoma. He had a CT scan of the chest in December of 2012 without evidence of recurrent chest mass. He does note that he saw Dr. Gerrit Heck for evaluation of possible anginal chest pain and had a stress test. Currently he has no complaints.      History  Smoking status  . Former Smoker  . Types: Cigarettes  . Quit date: 10/30/1988  Smokeless tobacco  . Never Used    Past Surgical History  Procedure Date  . Sternotomy 08/2010    for thymoma resection     No Known Allergies  Current Outpatient Prescriptions  Medication Sig Dispense Refill  . finasteride (PROSCAR) 5 MG tablet Take 5 mg by mouth daily.        Marland Kitchen lisinopril-hydrochlorothiazide (PRINZIDE,ZESTORETIC) 20-12.5 MG per tablet Take 1 tablet by mouth daily.        Marland Kitchen lovastatin (MEVACOR) 40 MG tablet Take 40 mg by mouth at bedtime.             Physical Exam: BP 137/81  Pulse 92  Resp 18  Ht 5\' 8"  (1.727 m)  Wt 168 lb (76.204 kg)  BMI 25.54 kg/m2  SpO2 95%  General appearance: alert, cooperative and no distress Neurologic: intact Heart: regular rate and rhythm, S1, S2 normal, no murmur, click, rub or gallop Lungs: clear to auscultation bilaterally Abdomen: soft, non-tender; bowel sounds normal; no masses,  no organomegaly Extremities: extremities normal, atraumatic, no cyanosis or edema Wound: sternum stable No cervical adenopathy  Diagnostic Studies & Laboratory data:         Recent Radiology Findings: Dg Chest 2 View  12/21/2011  *RADIOLOGY REPORT*  Clinical Data: 1-month follow-up appointment.  History of thymic  lesion removed 6 months ago.  CHEST - 2 VIEW  Comparison: Chest x-ray 09/28/2011.  Findings: Lung volumes are normal.  No consolidative airspace disease.  No pleural effusions.  No pneumothorax.  No pulmonary nodule or mass noted.  Pulmonary vasculature and the cardiomediastinal silhouette are within normal limits. Atherosclerotic calcifications in the arch of the aorta.  Status post median sternotomy for resection of the anterior mediastinal mass.  Lateral projection again demonstrates compression fractures at T11 and T12 with approximately 20% loss of height at both levels (unchanged).  IMPRESSION: 1.  No radiographic evidence of acute cardiopulmonary disease. 2.  No suspicious appearing pulmonary nodules or masses. 3.  Atherosclerosis. 4.  Postsurgical changes as above. 5.  Compression fractures of lower thoracic vertebral bodies (likely T11 and T12), unchanged.  Original Report Authenticated By: Florencia Reasons, M.D.      Recent Labs: Lab Results  Component Value Date   WBC 6.1 09/28/2011   HGB 15.8 09/28/2011   HCT 45.9 09/28/2011   PLT 241 09/28/2011   GLUCOSE 93 09/28/2011   GLUCOSE 93 09/28/2011   CHOL  Value: 113 (NOTE) ATP III Classification:      <  200        mg/dL        Desirable     478 - 239     mg/dL        Borderline High     >= 240        mg/dL        High  29/56/2130   TRIG 130 08/08/2010   ALT 25 09/28/2011   ALT 25 09/28/2011   AST 22 09/28/2011   AST 22 09/28/2011   NA 140 09/28/2011   NA 140 09/28/2011   K 4.5 09/28/2011   K 4.5 09/28/2011   CL 104 09/28/2011   CL 104 09/28/2011   CREATININE 0.99 09/28/2011   CREATININE 0.99 09/28/2011   BUN 19 09/28/2011   BUN 19 09/28/2011   CO2 28 09/28/2011   CO2 28 09/28/2011   INR 0.99 10/13/2010      Assessment / Plan:     Status post resection of a thymoma without evidence of capsular invasion. He returns today for followup visit. Chest x-ray shows no evidence of recurrence. He has been getting surveillance CT  scans through Dr. Gaylyn Rong office Although the chance of recurrence is low I will continue to follow him. Have made him an appointment to see me back in 6 months.    Delight Ovens MD 12/21/2011 12:56 PM

## 2011-12-21 NOTE — Patient Instructions (Signed)
Return in 6 months for chest xray 

## 2012-03-12 ENCOUNTER — Telehealth: Payer: Self-pay | Admitting: *Deleted

## 2012-03-12 NOTE — Telephone Encounter (Signed)
patient came in on 03-13-2012 requesting to cancel appointment for 03-2012 patient stated he is going out of the country and will not be back till 2014 did not want to at this time

## 2012-04-01 ENCOUNTER — Other Ambulatory Visit: Payer: Medicare Other | Admitting: Lab

## 2012-04-01 ENCOUNTER — Ambulatory Visit: Payer: Medicare Other | Admitting: Oncology

## 2012-06-17 ENCOUNTER — Other Ambulatory Visit: Payer: Self-pay | Admitting: Cardiothoracic Surgery

## 2012-06-17 DIAGNOSIS — D150 Benign neoplasm of thymus: Secondary | ICD-10-CM

## 2012-06-20 ENCOUNTER — Ambulatory Visit
Admission: RE | Admit: 2012-06-20 | Discharge: 2012-06-20 | Disposition: A | Discharge status: Home / Self Care | Payer: Medicare Other | Source: Ambulatory Visit | Attending: Cardiothoracic Surgery | Admitting: Cardiothoracic Surgery

## 2012-06-20 ENCOUNTER — Other Ambulatory Visit: Payer: Self-pay | Admitting: Cardiothoracic Surgery

## 2012-06-20 ENCOUNTER — Encounter: Payer: Self-pay | Admitting: Cardiothoracic Surgery

## 2012-06-20 ENCOUNTER — Ambulatory Visit (INDEPENDENT_AMBULATORY_CARE_PROVIDER_SITE_OTHER): Payer: Medicare Other | Admitting: Cardiothoracic Surgery

## 2012-06-20 VITALS — BP 126/82 | HR 83 | Resp 18 | Ht 68.0 in | Wt 168.0 lb

## 2012-06-20 DIAGNOSIS — D150 Benign neoplasm of thymus: Secondary | ICD-10-CM

## 2012-06-20 DIAGNOSIS — I10 Essential (primary) hypertension: Secondary | ICD-10-CM

## 2012-06-20 DIAGNOSIS — Z09 Encounter for follow-up examination after completed treatment for conditions other than malignant neoplasm: Secondary | ICD-10-CM

## 2012-06-20 LAB — CREATININE, SERUM: Creat: 0.94 mg/dL (ref 0.50–1.35)

## 2012-06-20 NOTE — Progress Notes (Signed)
[address]            [place] [address]          [phone]       [name] [facility] Medical Record #[id number] Date of Birth: [birth year]  Lonie Peak, MD [name], MD  Chief Complaint:    Follow Up Visit Thymoma resection Dec 2011   History of Present Illness:     Patient returns for followup visit after resection of a 7 x 5.5 x 5 cm thymoma. He had a CT scan of the chest in December of 2012 without evidence of recurrent chest mass.  He has no complaints of chest or abdominal pain. No palpable nodes. No SOB    History  Smoking status  . Former Smoker  . Types: Cigarettes  . Quit date: 10/30/1988  Smokeless tobacco  . Never Used    Past Surgical History  Procedure Date  . Sternotomy 08/2010    for thymoma resection     No Known Allergies  Current Outpatient Prescriptions  Medication Sig Dispense Refill  . finasteride (PROSCAR) 5 MG tablet Take 5 mg by mouth daily.        Marland Kitchen lisinopril-hydrochlorothiazide (PRINZIDE,ZESTORETIC) 20-12.5 MG per tablet Take 1 tablet by mouth daily.        Marland Kitchen lovastatin (MEVACOR) 40 MG tablet Take 40 mg by mouth at bedtime.             Physical Exam: BP 126/82  Pulse 83  Resp 18  Ht 5\' 8"  (1.727 m)  Wt 168 lb (76.204 kg)  BMI 25.54 kg/m2  SpO2 94%  General appearance: alert, cooperative and no distress Neurologic: intact Heart: regular rate and rhythm, S1, S2 normal, no murmur, click, rub or gallop Lungs: clear to auscultation bilaterally Abdomen: soft, non-tender; bowel sounds normal; no masses,  no organomegaly Extremities: extremities normal, atraumatic, no cyanosis or edema Wound: sternum stable No cervical adenopathy  Diagnostic Studies & Laboratory data:         Recent Radiology Findings: Dg Chest 2 View  06/20/2012  *RADIOLOGY REPORT*  Clinical Data: History of sarcoma within the mediastinum, also history of pancreatitis  CHEST - 2 VIEW  Comparison: Chest x-ray  of 12/21/2011 , and CT chest of 10/06/2011  Findings: No active infiltrate or effusion is seen. However, there is a questionable new nodular opacity in the right upper lung field medially.  This could represent overlapping bony and vascular structures, but in view of the patient's history a pulmonary nodule cannot be excluded and CT of the chest may be warranted. Mediastinal contours are stable.  The heart is mildly enlarged and stable.  Median sternotomy sutures are noted.  No acute bony abnormality is seen with partial compression of a lower thoracic vertebral body again noted.  IMPRESSION:  1.  Questionable new lung nodule in the right mid upper lung field. Consider CT of the chest in view of the patient's history. 2.  Stable mild cardiomegaly.   Original Report Authenticated By: Juline Patch, M.D.       Recent Labs: Lab Results  Component Value Date   WBC 6.1 09/28/2011   HGB 15.8 09/28/2011   HCT 45.9 09/28/2011   PLT 241 09/28/2011   GLUCOSE 93 09/28/2011   GLUCOSE 93 09/28/2011   CHOL  Value: 113 (NOTE) ATP III Classification:      <  200        mg/dL        Desirable     161 - 239     mg/dL        Borderline High     >= 240        mg/dL        High  09/60/4540   TRIG 130 08/08/2010   ALT 25 09/28/2011   ALT 25 09/28/2011   AST 22 09/28/2011   AST 22 09/28/2011   NA 140 09/28/2011   NA 140 09/28/2011   K 4.5 09/28/2011   K 4.5 09/28/2011   CL 104 09/28/2011   CL 104 09/28/2011   CREATININE 0.99 09/28/2011   CREATININE 0.99 09/28/2011   BUN 19 09/28/2011   BUN 19 09/28/2011   CO2 28 09/28/2011   CO2 28 09/28/2011   INR 0.99 10/13/2010      Assessment / Plan:     Status post resection of a thymoma without evidence of capsular invasion. He returns today for followup visit. .   With question finding on Chest xray will get follow up CT of the chest in the next week.   Delight Ovens MD 06/20/2012 11:22 AM

## 2012-06-22 LAB — BUN: BUN: 13 mg/dL (ref 6–23)

## 2012-07-04 ENCOUNTER — Ambulatory Visit
Admission: RE | Admit: 2012-07-04 | Discharge: 2012-07-04 | Disposition: A | Discharge status: Home / Self Care | Payer: Medicare Other | Source: Ambulatory Visit | Attending: Cardiothoracic Surgery | Admitting: Cardiothoracic Surgery

## 2012-07-04 ENCOUNTER — Ambulatory Visit (INDEPENDENT_AMBULATORY_CARE_PROVIDER_SITE_OTHER): Payer: Medicare Other | Admitting: Cardiothoracic Surgery

## 2012-07-04 ENCOUNTER — Encounter: Payer: Self-pay | Admitting: Cardiothoracic Surgery

## 2012-07-04 VITALS — BP 133/78 | HR 94 | Resp 16 | Ht 68.0 in | Wt 168.0 lb

## 2012-07-04 DIAGNOSIS — I10 Essential (primary) hypertension: Secondary | ICD-10-CM

## 2012-07-04 DIAGNOSIS — D150 Benign neoplasm of thymus: Secondary | ICD-10-CM

## 2012-07-04 DIAGNOSIS — Z09 Encounter for follow-up examination after completed treatment for conditions other than malignant neoplasm: Secondary | ICD-10-CM

## 2012-07-04 DIAGNOSIS — R911 Solitary pulmonary nodule: Secondary | ICD-10-CM

## 2012-07-04 MED ORDER — IOHEXOL 300 MG/ML  SOLN
75.0000 mL | Freq: Once | INTRAMUSCULAR | Status: AC | PRN
  Administered 2012-07-04: 75 mL via INTRAVENOUS

## 2012-07-04 NOTE — Progress Notes (Signed)
[address]            [place] [address]          [phone]        Rishik Tubby [facility] Medical Record #[id number] Date of Birth: 06-28-1941  Lonie Peak, MD [name], MD  Chief Complaint:    Follow Up Visit Thymoma resection Dec 2011   History of Present Illness:     Patient returns for followup visit after resection of a 7 x 5.5 x 5 cm thymoma. He had a CT scan of the chest in December of 2012 without evidence of recurrent chest mass.  He has no complaints of chest or abdominal pain. No palpable nodes. No SOB    History  Smoking status  . Former Smoker  . Types: Cigarettes  . Quit date: 10/30/1988  Smokeless tobacco  . Never Used    Past Surgical History  Procedure Date  . Sternotomy 08/2010    for thymoma resection     No Known Allergies  Current Outpatient Prescriptions  Medication Sig Dispense Refill  . finasteride (PROSCAR) 5 MG tablet Take 5 mg by mouth daily.        Marland Kitchen lisinopril-hydrochlorothiazide (PRINZIDE,ZESTORETIC) 20-12.5 MG per tablet Take 1 tablet by mouth daily.        Marland Kitchen lovastatin (MEVACOR) 40 MG tablet Take 40 mg by mouth at bedtime.         No current facility-administered medications for this visit.   Facility-Administered Medications Ordered in Other Visits  Medication Dose Route Frequency Provider Last Rate Last Dose  . iohexol (OMNIPAQUE) 300 MG/ML solution 75 mL  75 mL Intravenous Once PRN Medication Radiologist, MD   75 mL at 07/04/12 1012       Physical Exam: BP 133/78  Pulse 94  Resp 16  Ht 5\' 8"  (1.727 m)  Wt 168 lb (76.204 kg)  BMI 25.54 kg/m2  SpO2 94%  General appearance: alert, cooperative and no distress Neurologic: intact Heart: regular rate and rhythm, S1, S2 normal, no murmur, click, rub or gallop Lungs: clear to auscultation bilaterally Abdomen: soft, non-tender; bowel sounds normal; no masses,  no organomegaly Extremities: extremities normal,  atraumatic, no cyanosis or edema Wound: sternum stable No cervical adenopathy Exam unchanged from two weeks ago  Diagnostic Studies & Laboratory data:         Recent Radiology Findings: Ct Chest W Contrast  07/04/2012  *RADIOLOGY REPORT*  Clinical Data: Thymoma resection  CT CHEST WITH CONTRAST  Technique:  Multidetector CT imaging of the chest was performed following the standard protocol during bolus administration of intravenous contrast.  Contrast: 75mL OMNIPAQUE IOHEXOL 300 MG/ML  SOLN  Comparison: 10/06/2011 and 07/20/2010  Findings: Mild apical pleural parenchymal scarring.  3 mm left upper lobe nodule (series 4/image 12), unchanged from 2011, benign. No new/suspicious pulmonary nodules.  Mild dependent atelectasis in the bilateral lower lobes.  Mild paraseptal emphysematous changes. No pleural effusion or pneumothorax.  Visualized thyroid is unremarkable.  The heart is normal in size.  No pericardial effusion.  Coronary atherosclerosis.  Atherosclerotic calcifications of the aortic arch.  Median sternotomy and anterior mediastinal surgical clips from prior thymoma resection.  No residual abnormal soft tissue in the anterior mediastinum.  No suspicious mediastinal, hilar, or axillary lymphadenopathy.  Visualized upper abdomen is notable for cholecystectomy clips.  Mild degenerative changes of the visualized thoracolumbar spine.  IMPRESSION: No evidence of recurrent or metastatic disease in the chest.   Original Report Authenticated By: Charline Bills, M.D.       Recent Labs: Lab Results  Component Value Date   WBC 6.1 09/28/2011   HGB 15.8 09/28/2011   HCT 45.9 09/28/2011   PLT 241 09/28/2011   GLUCOSE 93 09/28/2011   GLUCOSE 93 09/28/2011   CHOL  Value: 113 (NOTE) ATP III Classification:      < 200        mg/dL        Desirable     409 - 239     mg/dL        Borderline High     >= 240        mg/dL        High  81/19/1478   TRIG 130 08/08/2010   ALT 25 09/28/2011   ALT 25 09/28/2011     AST 22 09/28/2011   AST 22 09/28/2011   NA 140 09/28/2011   NA 140 09/28/2011   K 4.5 09/28/2011   K 4.5 09/28/2011   CL 104 09/28/2011   CL 104 09/28/2011   CREATININE 0.94 06/20/2012   BUN 13 06/20/2012   CO2 28 09/28/2011   CO2 28 09/28/2011   INR 0.99 10/13/2010      Assessment / Plan:    Status post resection of a thymoma without evidence of capsular invasion. He returns today for followup visit. .   With question finding on Chest xray will got  follow up CT of the chest in the this  week. CT Scan of chest today shows no evidence of recurrence or new lung nodules Follow up chest xray in  8 months   Delight Ovens MD 07/04/2012 12:38 PM

## 2012-07-04 NOTE — Patient Instructions (Signed)
Return 8 months for chest xray

## 2013-03-04 ENCOUNTER — Other Ambulatory Visit: Payer: Self-pay | Admitting: *Deleted

## 2013-03-04 DIAGNOSIS — R911 Solitary pulmonary nodule: Secondary | ICD-10-CM

## 2013-03-06 ENCOUNTER — Ambulatory Visit
Admission: RE | Admit: 2013-03-06 | Discharge: 2013-03-06 | Disposition: A | Discharge status: Home / Self Care | Payer: Medicare Other | Source: Ambulatory Visit | Attending: Cardiothoracic Surgery | Admitting: Cardiothoracic Surgery

## 2013-03-06 ENCOUNTER — Ambulatory Visit (INDEPENDENT_AMBULATORY_CARE_PROVIDER_SITE_OTHER): Payer: Medicare Other | Admitting: Cardiothoracic Surgery

## 2013-03-06 ENCOUNTER — Encounter: Payer: Self-pay | Admitting: Cardiothoracic Surgery

## 2013-03-06 VITALS — BP 122/79 | HR 92 | Resp 20 | Ht 68.0 in | Wt 169.0 lb

## 2013-03-06 DIAGNOSIS — D150 Benign neoplasm of thymus: Secondary | ICD-10-CM

## 2013-03-06 DIAGNOSIS — R911 Solitary pulmonary nodule: Secondary | ICD-10-CM

## 2013-03-06 NOTE — Progress Notes (Signed)
[address]            [place] [address]          [phone]        [name] [facility] Medical Record #[id number] Date of Birth: [birth year]  No ref. provider found [name], MD  Chief Complaint:    Follow Up Visit Thymoma resection Dec 2011   History of Present Illness:     Patient returns for followup visit after resection of a 7 x 5.5 x 5 cm thymoma. He had a CT scan of the chest in December of 2012 without evidence of recurrent chest mass.  He has no complaints of chest or abdominal pain. No palpable nodes. No SOB    History  Smoking status  . Former Smoker  . Types: Cigarettes  . Quit date: 10/30/1988  Smokeless tobacco  . Never Used    Past Surgical History  Procedure Laterality Date  . Sternotomy  08/2010    for thymoma resection     No Known Allergies  Current Outpatient Prescriptions  Medication Sig Dispense Refill  . finasteride (PROSCAR) 5 MG tablet Take 5 mg by mouth daily.        . fluticasone (FLONASE) 50 MCG/ACT nasal spray Place 2 sprays into the nose daily.       Marland Kitchen lisinopril-hydrochlorothiazide (PRINZIDE,ZESTORETIC) 20-12.5 MG per tablet Take 1 tablet by mouth daily.        Marland Kitchen lovastatin (MEVACOR) 40 MG tablet Take 40 mg by mouth at bedtime.        Marland Kitchen ofloxacin (OCUFLOX) 0.3 % ophthalmic solution Place 2 drops into both eyes 4 (four) times daily.        No current facility-administered medications for this visit.       Physical Exam: BP 122/79  Pulse 92  Resp 20  Ht 5\' 8"  (1.727 m)  Wt 169 lb (76.658 kg)  BMI 25.7 kg/m2  SpO2 97%  General appearance: alert, cooperative and no distress Neurologic: intact Heart: regular rate and rhythm, S1, S2 normal, no murmur, click, rub or gallop Lungs: clear to auscultation bilaterally Abdomen: soft, non-tender; bowel sounds normal; no masses,  no organomegaly Extremities: extremities normal, atraumatic, no cyanosis or edema Wound:  sternum stable No cervical adenopathy Exam unchanged from two weeks ago  Diagnostic Studies & Laboratory data:         Recent Radiology Findings: Dg Chest 2 View  03/06/2013  *RADIOLOGY REPORT*  Clinical Data: Thymoma.  No current chest complaints.  CHEST - 2 VIEW  Comparison: CT 07/04/2012.  Findings: Median sternotomy wires are present.  Lungs appear clear. No mediastinal or hilar adenopathy on plain film.  Lower thoracic compression fracture appears similar to prior.  Surgical clips in the anterior mediastinum.  No airspace disease.  No effusion. Cervical spondylosis partially visualized.  IMPRESSION: No active cardiopulmonary disease.  Postoperative changes of median sternotomy, likely for thymoma resection.   Original Report Authenticated By: Andreas Newport, M.D.       Recent Labs: Lab Results  Component Value Date   WBC 6.1 09/28/2011   HGB 15.8 09/28/2011   HCT 45.9 09/28/2011   PLT 241 09/28/2011   GLUCOSE 93 09/28/2011   CHOL  Value: 113 (NOTE) ATP III Classification:      <  200        mg/dL        Desirable     161 - 239     mg/dL        Borderline High     >= 240        mg/dL        High  09/60/4540   TRIG 130 08/08/2010   ALT 25 09/28/2011   AST 22 09/28/2011   NA 140 09/28/2011   K 4.5 09/28/2011   CL 104 09/28/2011   CREATININE 0.94 06/20/2012   BUN 13 06/20/2012   CO2 28 09/28/2011   INR 0.99 10/13/2010      Assessment / Plan:    Status post resection of a thymoma without evidence of capsular invasion. He returns today for followup visit. .   CT Scan of chest 6 months ago showedno evidence of recurrence or new lung nodules Follow up chest xray done today is stable. The findings were reviewed with the patient, in October/November 2014 repeat CT scan will be performed.   Delight Ovens MD 03/06/2013 11:03 AM

## 2013-03-06 NOTE — Patient Instructions (Signed)
Return in about 6 months for repeat ct of the chest Chest xray today looks good

## 2013-04-16 ENCOUNTER — Encounter: Payer: Self-pay | Admitting: Internal Medicine

## 2013-05-07 ENCOUNTER — Encounter: Payer: Self-pay | Admitting: Internal Medicine

## 2013-05-07 ENCOUNTER — Ambulatory Visit (INDEPENDENT_AMBULATORY_CARE_PROVIDER_SITE_OTHER): Payer: Medicaid Other | Admitting: Internal Medicine

## 2013-05-07 VITALS — BP 120/78 | HR 84 | Ht 66.5 in | Wt 171.1 lb

## 2013-05-07 DIAGNOSIS — R1084 Generalized abdominal pain: Secondary | ICD-10-CM

## 2013-05-07 DIAGNOSIS — K5900 Constipation, unspecified: Secondary | ICD-10-CM

## 2013-05-07 DIAGNOSIS — R141 Gas pain: Secondary | ICD-10-CM

## 2013-05-07 DIAGNOSIS — R143 Flatulence: Secondary | ICD-10-CM

## 2013-05-07 DIAGNOSIS — K219 Gastro-esophageal reflux disease without esophagitis: Secondary | ICD-10-CM

## 2013-05-07 MED ORDER — MOVIPREP 100 G PO SOLR: 1.0000 | Freq: Once | ORAL | Status: DC

## 2013-05-07 NOTE — Patient Instructions (Addendum)

## 2013-05-07 NOTE — Progress Notes (Signed)
HISTORY OF PRESENT ILLNESS:  [name] is a 72 y.o. male with a history of hypertension, hyperlipidemia, malignant thymoma status post resection November 2001, acute biliary pancreatitis September 2011 status post cholecystectomy March 2012. He presents today regarding a number of abdominal complaints and the need for screening colonoscopy. The patient's wife is a patient of mine. He denies prior GI evaluations, aside from inpatient assessment for pancreatitis. He reports problems with constipation for many years. Over the past year he has noticed increased intestinal gas with abdominal fullness. He also describes a twisting or churning sensation in the lower half of his abdomen once or twice per day. He cannot identify any exacerbating factors. Symptoms might be relieved with defecation. He is on no particular agents for his bowels. He denies GI bleeding. He does have a history of chronic GERD intermittently for which she takes over-the-counter acid suppressive medications with relief. He also reports problems with epigastric discomfort, occasionally exacerbated by meals. This has occurred off and on for the past few years. He reports a good appetite and denies weight loss. He denies family history of colon cancer  REVIEW OF SYSTEMS:  All non-GI ROS negative except for sinus and allergy trouble, back pain, visual change, headaches, itching, sore throat, nosebleeds  Past Medical History  Diagnosis Date  . Hypertension   . Hyperlipidemia   . Pancreatitis   . Thymoma, malignant     Past Surgical History  Procedure Laterality Date  . Sternotomy  08/2010    for thymoma resection  . Cholecystectomy      Social History [name]  reports that he quit smoking about 24 years ago. His smoking use included Cigarettes. He smoked 0.00 packs per day. He has never used smokeless tobacco. He reports that he does not drink alcohol or use illicit drugs.  family history includes Lung cancer in his  sister.  No Known Allergies     PHYSICAL EXAMINATION: Vital signs: BP 120/78  Pulse 84  Ht 5' 6.5" (1.689 m)  Wt 171 lb 2 oz (77.622 kg)  BMI 27.21 kg/m2  Constitutional: generally well-appearing, no acute distress Psychiatric: alert and oriented x3, cooperative Eyes: extraocular movements intact, anicteric, conjunctiva pink Mouth: oral pharynx moist, no lesions Neck: supple no lymphadenopathy Cardiovascular: heart regular rate and rhythm, no murmur Lungs: clear to auscultation bilaterally Abdomen: soft, nontender, nondistended, no obvious ascites, no peritoneal signs, normal bowel sounds, no organomegaly. Prior surgical incisions well-healed Rectal: Deferred until colonoscopy Extremities: no lower extremity edema bilaterally Skin: no lesions on visible extremities Neuro: No focal deficits.   ASSESSMENT:  #1. Functional constipation associated with abdominal discomfort. #2. Increased intestinal gas associated with bloating #3. Chronic GERD #4. Epigastric discomfort of uncertain etiology #5. History of biliary pancreatitis September 2011 status post cholecystectomy March 2012 #6. Colon cancer screening. Baseline risk. No contraindication   PLAN:  #1. Reflux precautions #2. OTC acid suppressive agents on demand #3. Upper endoscopy to evaluate recurrent epigastric discomfort and chronic GERD.The nature of the procedure, as well as the risks, benefits, and alternatives were carefully and thoroughly reviewed with the patient. Ample time for discussion and questions allowed. The patient understood, was satisfied, and agreed to proceed. #4. Screening colonoscopy.The nature of the procedure, as well as the risks, benefits, and alternatives were carefully and thoroughly reviewed with the patient. Ample time for discussion and questions allowed. The patient understood, was satisfied, and agreed to proceed. #5. Movi prep prescribed. The patient instructed on its use #6. Recommend agent  for constipation post colonoscopy #7. Recommend probiotic post colonoscopy for increased intestinal gas #8. Ongoing general medical care with Dr. Paulino Rily

## 2013-05-21 ENCOUNTER — Ambulatory Visit (AMBULATORY_SURGERY_CENTER): Payer: Medicare Other | Admitting: Internal Medicine

## 2013-05-21 ENCOUNTER — Encounter: Payer: Self-pay | Admitting: Internal Medicine

## 2013-05-21 VITALS — BP 127/72 | HR 67 | Temp 96.4°F | Resp 21 | Ht 66.5 in | Wt 171.0 lb

## 2013-05-21 DIAGNOSIS — D126 Benign neoplasm of colon, unspecified: Secondary | ICD-10-CM

## 2013-05-21 DIAGNOSIS — Z1211 Encounter for screening for malignant neoplasm of colon: Secondary | ICD-10-CM

## 2013-05-21 DIAGNOSIS — K219 Gastro-esophageal reflux disease without esophagitis: Secondary | ICD-10-CM

## 2013-05-21 DIAGNOSIS — K253 Acute gastric ulcer without hemorrhage or perforation: Secondary | ICD-10-CM

## 2013-05-21 DIAGNOSIS — R1084 Generalized abdominal pain: Secondary | ICD-10-CM

## 2013-05-21 MED ORDER — OMEPRAZOLE 40 MG PO CPDR: 40.0000 mg | DELAYED_RELEASE_CAPSULE | Freq: Every day | ORAL | Status: DC

## 2013-05-21 MED ORDER — SODIUM CHLORIDE 0.9 % IV SOLN: 500.0000 mL | INTRAVENOUS | Status: DC

## 2013-05-21 NOTE — Op Note (Signed)
[facility] [address] [place], [address]   ENDOSCOPY PROCEDURE REPORT  PATIENT: Mccade, Sullenberger  MR#: [id number] BIRTHDATE: [birth year] , 71  yrs. old GENDER: Male ENDOSCOPIST: Roxy Cedar, MD REFERRED BY:  Mila Palmer, M.D. PROCEDURE DATE:  05/21/2013 PROCEDURE:  EGD w/ biopsy ASA CLASS:     Class II INDICATIONS:  Epigastric pain.   History of esophageal reflux. MEDICATIONS: MAC sedation, administered by CRNA and propofol (Diprivan) 80mg  IV TOPICAL ANESTHETIC: none  DESCRIPTION OF PROCEDURE: After the risks benefits and alternatives of the procedure were thoroughly explained, informed consent was obtained.  The LB [id number] endoscope was introduced through the mouth and advanced to the second portion of the duodenum. Without limitations.  The instrument was slowly withdrawn as the mucosa was fully examined.    The proximal and midesophagus was normal.  The distal esophagus revealed esophagitis at the mucosal Z.  line as manifested by friability and edema.  Possible early stricture as well.  The proximal stomach and body appeared normal.  The antrum revealed erosive gastritis as well as a focal 1 cm ulcer on the angularis. Biopsies for Helicobacter pylori (CLO test) were taken.  The duodenal bulb and post bulbar duodenum were normal.  Retroflexed views revealed no abnormalities.     The scope was then withdrawn from the patient and the procedure completed.  COMPLICATIONS: There were no complications. ENDOSCOPIC IMPRESSION: 1. Reflux esophagitis 2. Erosive gastritis and gastric ulcer  RECOMMENDATIONS: 1.  Prescribe omeprazole 40 mg by mouth daily; #30; 11 refills 2.  Rx CLO if positive 3. Avoid NSAIDs such as ibuprofen, Aleve, Motrin, etc. 4.  Schedule upper Endoscopy in the [facility] in 8 weeks to assess for ulcer healing.  REPEAT EXAM:  eSigned:  Roxy Cedar, MD 05/21/2013 2:54 PM  [id number] Paulino Rily, MD and The Patient

## 2013-05-21 NOTE — Progress Notes (Signed)
Called to room to assist during endoscopic procedure.  Patient ID and intended procedure confirmed with present staff. Received instructions for my participation in the procedure from the performing physician.  

## 2013-05-21 NOTE — Progress Notes (Signed)
The pt has nasal congestion noted when he woke up from the sedation.  He said this was a new sx after sedation.  I made Dr. Marina Goodell aware of this and he said, "if only congestion and no wheezing noted let the pt get up and moving and ok to d/c to home". Maw

## 2013-05-21 NOTE — Patient Instructions (Addendum)
YOU HAD AN ENDOSCOPIC PROCEDURE TODAY AT THE [facility]: Refer to the procedure report that was given to you for any specific questions about what was found during the examination.  If the procedure report does not answer your questions, please call your gastroenterologist to clarify.  If you requested that your care partner not be given the details of your procedure findings, then the procedure report has been included in a sealed envelope for you to review at your convenience later.  YOU SHOULD EXPECT: Some feelings of bloating in the abdomen. Passage of more gas than usual.  Walking can help get rid of the air that was put into your GI tract during the procedure and reduce the bloating. If you had a lower endoscopy (such as a colonoscopy or flexible sigmoidoscopy) you may notice spotting of blood in your stool or on the toilet paper. If you underwent a bowel prep for your procedure, then you may not have a normal bowel movement for a few days.  DIET: Your first meal following the procedure should be a light meal and then it is ok to progress to your normal diet.  A half-sandwich or bowl of soup is an example of a good first meal.  Heavy or fried foods are harder to digest and may make you feel nauseous or bloated.  Likewise meals heavy in dairy and vegetables can cause extra gas to form and this can also increase the bloating.  Drink plenty of fluids but you should avoid alcoholic beverages for 24 hours.  ACTIVITY: Your care partner should take you home directly after the procedure.  You should plan to take it easy, moving slowly for the rest of the day.  You can resume normal activity the day after the procedure however you should NOT DRIVE or use heavy machinery for 24 hours (because of the sedation medicines used during the test).    SYMPTOMS TO REPORT IMMEDIATELY: A gastroenterologist can be reached at any hour.  During normal business hours, 8:30 AM to 5:00 PM Monday through Friday,  call [phone].  After hours and on weekends, please call the GI answering service at [phone] who will take a message and have the physician on call contact you.   Following lower endoscopy (colonoscopy or flexible sigmoidoscopy):  Excessive amounts of blood in the stool  Significant tenderness or worsening of abdominal pains  Swelling of the abdomen that is new, acute  Fever of 100F or higher  Following upper endoscopy (EGD)  Vomiting of blood or coffee ground material  New chest pain or pain under the shoulder blades  Painful or persistently difficult swallowing  New shortness of breath  Fever of 100F or higher  Black, tarry-looking stools  FOLLOW UP: If any biopsies were taken you will be contacted by phone or by letter within the next 1-3 weeks.  Call your gastroenterologist if you have not heard about the biopsies in 3 weeks.  Our staff will call the home number listed on your records the next business day following your procedure to check on you and address any questions or concerns that you may have at that time regarding the information given to you following your procedure. This is a courtesy call and so if there is no answer at the home number and we have not heard from you through the emergency physician on call, we will assume that you have returned to your regular daily activities without incident.     Handouts were  given to your care partner on polyps, diverticulosis, high fiber diet, esophagitis and gastritis. Rx sent in OMEPRAZOLE 40mg  by mouth daily #30 with 11 refills sent to [facility]. Avoid NSAIDs such as ibuprofen, Aleve, Motrin etc. Upper endoscopy (EGD) scheduled for 8 weeks onTuesday 07-22-2013 at 11:00 am.  Previsit on Wed 07-16-2013 @10 :00 am.   SIGNATURES/CONFIDENTIALITY: You and/or your care partner have signed paperwork which will be entered into your electronic medical record.  These signatures attest to the fact that that the  information above on your After Visit Summary has been reviewed and is understood.  Full responsibility of the confidentiality of this discharge information lies with you and/or your care-partner.

## 2013-05-21 NOTE — Op Note (Addendum)
[facility] [address] [place], [address]   COLONOSCOPY PROCEDURE REPORT  PATIENT: [name], [name]  MR#: [id number] BIRTHDATE: 1941/03/12 , 71  yrs. old GENDER: Male ENDOSCOPIST: Roxy Cedar, MD REFERRED XB:JYNWGN Paulino Rily, M.D. PROCEDURE DATE:  05/21/2013 PROCEDURE:   Colonoscopy with snare polypectomy   x 2 ASA CLASS:   Class II INDICATIONS:average risk screening. MEDICATIONS: MAC sedation, administered by CRNA and propofol (Diprivan) 280mg  IV  DESCRIPTION OF PROCEDURE:   After the risks benefits and alternatives of the procedure were thoroughly explained, informed consent was obtained.  A digital rectal exam revealed no abnormalities of the rectum.   The LB [id number]  endoscope was introduced through the anus and advanced to the cecum, which was identified by both the appendix and ileocecal valve. No adverse events experienced.   The quality of the prep was excellent, using MoviPrep  The instrument was then slowly withdrawn as the colon was fully examined.    COLON FINDINGS: Two sessile polyps, 6 and 7mm in size were found in the ascending colon.  A polypectomy was performed with a cold snare.  The resection was complete and the polyp tissue was completely retrieved.   Mild diverticulosis was noted to be scattered throughout the  colon.   The colon mucosa was otherwise normal.  Retroflexed views revealed internal hemorrhoids. The time to cecum=3 minutes 46 seconds.  Withdrawal time=16 minutes 14 seconds.  The scope was withdrawn and the procedure completed.  COMPLICATIONS: There were no complications.  ENDOSCOPIC IMPRESSION: 1.   Two polyps in the ascending colon; polypectomy was performed with a cold snare 2.   Mild diverticulosis was noted throughout the entire examined colon 3.   The colon mucosa was otherwise normal  RECOMMENDATIONS: 1.  Follow up colonoscopy in 5 years 2.  Upper endoscopy today (see report)   eSigned:  Roxy Cedar, MD 05/21/2013 2:46 PM Revised: 05/21/2013 2:46 PM  cc: Mila Palmer, MD and The Patient   PATIENT NAME:  [name], [name] MR#: [id number]

## 2013-05-21 NOTE — Progress Notes (Signed)
A/ox3 pleased with MAC, report to Annette RN 

## 2013-05-21 NOTE — Progress Notes (Signed)
Pt congestion was improving before discharge. [name]

## 2013-05-23 ENCOUNTER — Telehealth: Payer: Self-pay

## 2013-05-23 NOTE — Telephone Encounter (Signed)
  Follow up Call-  Call back number 05/21/2013  Post procedure Call Back phone  # [phone]  Permission to leave phone message Yes     Patient questions:  Do you have a fever, pain , or abdominal swelling? no Pain Score  0 *  Have you tolerated food without any problems? yes  Have you been able to return to your normal activities? yes  Do you have any questions about your discharge instructions: Diet   no Medications  no Follow up visit  no  Do you have questions or concerns about your Care? no  Actions: * If pain score is 4 or above: No action needed, pain <4.  No problems or questions per the pt. Maw

## 2013-05-26 ENCOUNTER — Encounter: Payer: Self-pay | Admitting: Internal Medicine

## 2013-06-20 ENCOUNTER — Telehealth: Payer: Self-pay

## 2013-06-20 NOTE — Telephone Encounter (Signed)
Erroneous encounter; rx for Omeprazole already filled

## 2013-06-24 ENCOUNTER — Telehealth: Payer: Self-pay

## 2013-06-24 DIAGNOSIS — K253 Acute gastric ulcer without hemorrhage or perforation: Secondary | ICD-10-CM

## 2013-06-24 DIAGNOSIS — K219 Gastro-esophageal reflux disease without esophagitis: Secondary | ICD-10-CM

## 2013-06-24 MED ORDER — OMEPRAZOLE 40 MG PO CPDR: 40.0000 mg | DELAYED_RELEASE_CAPSULE | Freq: Every day | ORAL | Status: DC

## 2013-06-24 NOTE — Telephone Encounter (Signed)
Resent rx for Omeprazole in 90 day supply per pharmacy request

## 2013-07-16 ENCOUNTER — Ambulatory Visit (AMBULATORY_SURGERY_CENTER): Payer: Self-pay | Admitting: *Deleted

## 2013-07-16 VITALS — Ht 66.5 in | Wt 169.0 lb

## 2013-07-16 DIAGNOSIS — K253 Acute gastric ulcer without hemorrhage or perforation: Secondary | ICD-10-CM

## 2013-07-16 NOTE — Progress Notes (Signed)
Patient denies any allergies to eggs or soy. Patient denies any problems with anesthesia.  

## 2013-07-17 ENCOUNTER — Encounter: Payer: Self-pay | Admitting: Internal Medicine

## 2013-07-22 ENCOUNTER — Ambulatory Visit (AMBULATORY_SURGERY_CENTER): Payer: Medicare Other | Admitting: Internal Medicine

## 2013-07-22 ENCOUNTER — Encounter: Payer: Self-pay | Admitting: Internal Medicine

## 2013-07-22 VITALS — BP 145/80 | HR 69 | Temp 97.3°F | Resp 18 | Ht 66.5 in | Wt 169.0 lb

## 2013-07-22 DIAGNOSIS — K253 Acute gastric ulcer without hemorrhage or perforation: Secondary | ICD-10-CM

## 2013-07-22 DIAGNOSIS — K219 Gastro-esophageal reflux disease without esophagitis: Secondary | ICD-10-CM

## 2013-07-22 DIAGNOSIS — K222 Esophageal obstruction: Secondary | ICD-10-CM

## 2013-07-22 MED ORDER — SODIUM CHLORIDE 0.9 % IV SOLN: 500.0000 mL | INTRAVENOUS | Status: DC

## 2013-07-22 NOTE — Progress Notes (Signed)
Report given to Clide Cliff, RN to resume care of the pt. Maw

## 2013-07-22 NOTE — Progress Notes (Signed)
No complaints noted in the recovery room. Maw   

## 2013-07-22 NOTE — Patient Instructions (Addendum)

## 2013-07-22 NOTE — Op Note (Signed)
[facility] [address] [place], [address]   ENDOSCOPY PROCEDURE REPORT  PATIENT: Lenorris, Karger  MR#: [id number] BIRTHDATE: [birth year] , 72  yrs. old GENDER: Male ENDOSCOPIST: Roxy Cedar, MD REFERRED BY:  Surveillance Program Recall PROCEDURE DATE:  07/22/2013 PROCEDURE:  EGD, diagnostic ASA CLASS:     Class II INDICATIONS:  Surveillance. Follow up gastric ulcer dx 05-21-13 MEDICATIONS: MAC sedation, administered by CRNA and Propofol (Diprivan) 120 mg IV TOPICAL ANESTHETIC: none  DESCRIPTION OF PROCEDURE: After the risks benefits and alternatives of the procedure were thoroughly explained, informed consent was obtained.  The LB [id number] endoscope was introduced through the mouth and advanced to the second portion of the duodenum. Without limitations.  The instrument was slowly withdrawn as the mucosa was fully examined.      EXAM:   Benign distal esophageal stricture (16mm).  normal stomach and duodenum.  Ulcer healed.  Retroflexed views revealed no abnormalities.     The scope was then withdrawn from the patient and the procedure completed.  COMPLICATIONS: There were no complications. ENDOSCOPIC IMPRESSION: 1.Benign distal esophageal stricture (16mm). 2. Normal stomach and duodenum.  Ulcer healed.  RECOMMENDATIONS: 1. Continue PPI (omeprazole) once daily  REPEAT EXAM:  eSigned:  Roxy Cedar, MD 07/22/2013 11:38 AM   MW:NUUVOZ Paulino Rily, MD and The Patient

## 2013-07-22 NOTE — Progress Notes (Signed)
Report to pacu rn, vss, bbs=clear 

## 2013-07-23 ENCOUNTER — Telehealth: Payer: Self-pay | Admitting: *Deleted

## 2013-07-23 NOTE — Telephone Encounter (Signed)
  Follow up Call-  Call back number 07/22/2013 05/21/2013  Post procedure Call Back phone  # [phone]  Permission to leave phone message Yes Yes     Patient questions:  Do you have a fever, pain , or abdominal swelling? no Pain Score  0 *  Have you tolerated food without any problems? yes  Have you been able to return to your normal activities? yes  Do you have any questions about your discharge instructions: Diet   no Medications  no Follow up visit  no  Do you have questions or concerns about your Care? no  Actions: * If pain score is 4 or above: No action needed, pain <4.

## 2013-07-24 ENCOUNTER — Other Ambulatory Visit: Payer: Self-pay | Admitting: *Deleted

## 2013-07-25 ENCOUNTER — Other Ambulatory Visit: Payer: Self-pay | Admitting: *Deleted

## 2013-07-25 DIAGNOSIS — R911 Solitary pulmonary nodule: Secondary | ICD-10-CM

## 2013-09-04 ENCOUNTER — Ambulatory Visit (INDEPENDENT_AMBULATORY_CARE_PROVIDER_SITE_OTHER): Payer: Medicare Other | Admitting: Cardiothoracic Surgery

## 2013-09-04 ENCOUNTER — Encounter: Payer: Self-pay | Admitting: Cardiothoracic Surgery

## 2013-09-04 ENCOUNTER — Ambulatory Visit
Admission: RE | Admit: 2013-09-04 | Discharge: 2013-09-04 | Disposition: A | Discharge status: Home / Self Care | Payer: Medicare Other | Source: Ambulatory Visit | Attending: Cardiothoracic Surgery | Admitting: Cardiothoracic Surgery

## 2013-09-04 VITALS — BP 127/82 | HR 94 | Resp 20 | Ht 66.5 in | Wt 167.0 lb

## 2013-09-04 DIAGNOSIS — D150 Benign neoplasm of thymus: Secondary | ICD-10-CM

## 2013-09-04 DIAGNOSIS — R911 Solitary pulmonary nodule: Secondary | ICD-10-CM

## 2013-09-04 NOTE — Progress Notes (Signed)
[address]       [place] [address]             [phone]        Vi Whitesel [facility] Medical Record #[id number] Date of Birth: 24-Sep-1941  No ref. provider found [name], MD  Chief Complaint:    Follow Up Visit Thymoma resection Dec 2011   History of Present Illness:     Patient returns for followup visit after resection of a 7 x 5.5 x 5 cm thymoma. He had a CT scan of the chest in December of 2012 without evidence of recurrent chest mass.  He has no complaints of chest or abdominal pain. No palpable nodes. No SOB.     History  Smoking status  . Former Smoker  . Types: Cigarettes  . Quit date: 10/30/1988  Smokeless tobacco  . Never Used    Past Surgical History  Procedure Laterality Date  . Sternotomy  08/2010    for thymoma resection  . Cholecystectomy       No Known Allergies  Current Outpatient Prescriptions  Medication Sig Dispense Refill  . finasteride (PROSCAR) 5 MG tablet Take 5 mg by mouth daily.        Marland Kitchen lisinopril-hydrochlorothiazide (PRINZIDE,ZESTORETIC) 20-12.5 MG per tablet Take 1 tablet by mouth daily.        Marland Kitchen lovastatin (MEVACOR) 40 MG tablet Take 40 mg by mouth at bedtime.        Marland Kitchen omeprazole (PRILOSEC) 40 MG capsule Take 1 capsule (40 mg total) by mouth daily.  90 capsule  3   No current facility-administered medications for this visit.       Physical Exam: BP 127/82  Pulse 94  Resp 20  Ht 5' 6.5" (1.689 m)  Wt 167 lb (75.751 kg)  BMI 26.55 kg/m2  SpO2 96%  General appearance: alert, cooperative and no distress Neurologic: intact Heart: regular rate and rhythm, S1, S2 normal, no murmur, click, rub or gallop Lungs: clear to auscultation bilaterally Abdomen: soft, non-tender; bowel sounds normal; no masses,  no organomegaly Extremities: extremities normal, atraumatic, no cyanosis or edema Wound: sternum stable No cervical adenopathy   Diagnostic Studies &  Laboratory data:         Recent Radiology Findings: Ct Chest Wo Contrast  09/04/2013   CLINICAL DATA:  History of resection of thymoma in 2011, follow-up  EXAM: CT CHEST WITHOUT CONTRAST  TECHNIQUE: Multidetector CT imaging of the chest was performed following the standard protocol without IV contrast.  COMPARISON:  CT chest of 07/04/2012  FINDINGS: Apparent pleural parenchymal scarring particularly in the left lung apex is stable with 2 tiny nodules which appear stable as well in the left apex. No new parenchymal lesion is seen. No enlarging nodule is noted. There is no evidence of pleural effusion. The central airway is patent.  On soft tissue window images, the thyroid gland is unremarkable. On this unenhanced study, of atheromatous changes are noted throughout the entire thoracic aorta. No mediastinal or hilar adenopathy is seen. Faint Coronary artery calcification is noted, and median sternotomy sutures are noted from prior thymoma resection. No abnormal soft tissue in the anterior mediastinum is seen. No bony abnormality is seen. The upper abdomen is unremarkable with prior cholecystectomy .  IMPRESSION: No evidence of  recurrence of thymoma. No adenopathy.   Electronically Signed   By: Dwyane Dee M.D.   On: 09/04/2013 13:00      Recent Labs: Lab Results  Component Value Date   WBC 6.1 09/28/2011   HGB 15.8 09/28/2011   HCT 45.9 09/28/2011   PLT 241 09/28/2011   GLUCOSE 93 09/28/2011   CHOL  Value: 113 (NOTE) ATP III Classification:      < 200        mg/dL        Desirable     161 - 239     mg/dL        Borderline High     >= 240        mg/dL        High  09/60/4540   TRIG 130 08/08/2010   ALT 25 09/28/2011   AST 22 09/28/2011   NA 140 09/28/2011   K 4.5 09/28/2011   CL 104 09/28/2011   CREATININE 0.94 06/20/2012   BUN 13 06/20/2012   CO2 28 09/28/2011   INR 0.99 10/13/2010      Assessment / Plan:    Status post resection of a thymoma without evidence of capsular invasion. He  returns today for followup visit. .   CT Scan of chest  showed no evidence of recurrence or new lung nodules  The findings were reviewed with the patient, in October/November 2015 repeat CT scan will be performed.   Delight Ovens MD 09/04/2013 2:27 PM

## 2014-01-07 ENCOUNTER — Ambulatory Visit: Payer: Medicare Other | Admitting: Cardiology

## 2014-03-27 ENCOUNTER — Telehealth: Payer: Self-pay

## 2014-03-27 DIAGNOSIS — K253 Acute gastric ulcer without hemorrhage or perforation: Secondary | ICD-10-CM

## 2014-03-27 DIAGNOSIS — K219 Gastro-esophageal reflux disease without esophagitis: Secondary | ICD-10-CM

## 2014-03-27 MED ORDER — OMEPRAZOLE 40 MG PO CPDR: 40.0000 mg | DELAYED_RELEASE_CAPSULE | Freq: Every day | ORAL | Status: DC

## 2014-03-27 NOTE — Telephone Encounter (Signed)
Refilled Omeprazole - 90 day supply

## 2014-04-14 ENCOUNTER — Telehealth: Payer: Self-pay

## 2014-04-14 NOTE — Telephone Encounter (Signed)
[name] called our office with complaints of chest pain.  He was advised to seek medical attention from his cardiologist(Dr. Marlou Porch) or his medical doctor.  Following the call he showed up in our office and was again instructed to call or go to his cardiologist regarding his right sided chest pain.  He is being followed by Dr. Servando Snare for s/p resection Thymoma in December 2011 with a yearly follow up & CT Chest due in November 2015.  Again, the patient was instructed to see his medical doctor or his cardiologist to see if the problem was cardiac related or medical.  [name] left the office in frustration.

## 2014-04-16 ENCOUNTER — Ambulatory Visit
Admission: RE | Admit: 2014-04-16 | Discharge: 2014-04-16 | Disposition: A | Discharge status: Home / Self Care | Payer: Medicare Other | Source: Ambulatory Visit | Attending: Family Medicine | Admitting: Family Medicine

## 2014-04-16 ENCOUNTER — Other Ambulatory Visit: Payer: Self-pay | Admitting: Family Medicine

## 2014-04-16 DIAGNOSIS — R079 Chest pain, unspecified: Secondary | ICD-10-CM

## 2014-08-18 ENCOUNTER — Other Ambulatory Visit: Payer: Self-pay | Admitting: *Deleted

## 2014-08-18 DIAGNOSIS — D150 Benign neoplasm of thymus: Principal | ICD-10-CM

## 2014-08-18 DIAGNOSIS — D4989 Neoplasm of unspecified behavior of other specified sites: Secondary | ICD-10-CM

## 2014-09-17 ENCOUNTER — Other Ambulatory Visit: Payer: Medicare Other

## 2014-09-17 ENCOUNTER — Ambulatory Visit: Payer: Medicare Other | Admitting: Cardiothoracic Surgery

## 2014-09-23 ENCOUNTER — Other Ambulatory Visit: Payer: Self-pay | Admitting: Internal Medicine

## 2014-10-01 ENCOUNTER — Encounter: Payer: Self-pay | Admitting: Cardiothoracic Surgery

## 2014-10-01 ENCOUNTER — Ambulatory Visit (INDEPENDENT_AMBULATORY_CARE_PROVIDER_SITE_OTHER): Payer: Medicare Other | Admitting: Cardiothoracic Surgery

## 2014-10-01 ENCOUNTER — Ambulatory Visit
Admission: RE | Admit: 2014-10-01 | Discharge: 2014-10-01 | Disposition: A | Discharge status: Home / Self Care | Payer: Medicare Other | Source: Ambulatory Visit | Attending: Cardiothoracic Surgery | Admitting: Cardiothoracic Surgery

## 2014-10-01 DIAGNOSIS — D150 Benign neoplasm of thymus: Principal | ICD-10-CM

## 2014-10-01 DIAGNOSIS — D4989 Neoplasm of unspecified behavior of other specified sites: Secondary | ICD-10-CM

## 2014-10-01 NOTE — Progress Notes (Signed)
[address]       [place] [address]             [phone]        Seanpatrick [facility] Medical Record #[id number] Date of Birth: [birth year]  Eppie Gibson, MD Alessandra Grout, MD  Chief Complaint:    Follow Up Visit Thymoma resection Dec 2011   History of Present Illness:     Patient returns for followup visit after resection of a 7 x 5.5 x 5 cm thymoma. .  He has no complaints of chest or abdominal pain. No palpable nodes. No SOB.     History  Smoking status  . Former Smoker  . Types: Cigarettes  . Quit date: 10/30/1988  Smokeless tobacco  . Never Used    Past Surgical History  Procedure Laterality Date  . Sternotomy  08/2010    for thymoma resection  . Cholecystectomy       No Known Allergies  Current Outpatient Prescriptions  Medication Sig Dispense Refill  . finasteride (PROSCAR) 5 MG tablet Take 5 mg by mouth daily.      Marland Kitchen lisinopril-hydrochlorothiazide (PRINZIDE,ZESTORETIC) 20-12.5 MG per tablet Take 1 tablet by mouth daily.       No current facility-administered medications for this visit.       Physical Exam: BP 99/68 mmHg  Pulse 88  Resp 16  Ht 5' 6.5" (1.689 m)  Wt 161 lb (73.029 kg)  BMI 25.60 kg/m2  SpO2 97%  General appearance: alert, cooperative and no distress Neurologic: intact Heart: regular rate and rhythm, S1, S2 normal, no murmur, click, rub or gallop Lungs: clear to auscultation bilaterally Abdomen: soft, non-tender; bowel sounds normal; no masses,  no organomegaly Extremities: extremities normal, atraumatic, no cyanosis or edema Wound: sternum stable No cervical adenopathy   Diagnostic Studies & Laboratory data:         Recent Radiology Findings: Ct Chest Wo Contrast  10/01/2014   CLINICAL DATA:  History of thymoma.  Surgery 2011.  EXAM: CT CHEST WITHOUT CONTRAST  TECHNIQUE: Multidetector CT imaging of the chest was performed following the standard protocol  without IV contrast.  COMPARISON:  04/16/2014 chest x-ray.  Chest CT 09/04/2013.  FINDINGS: Patient is status post prior median sternotomy. No anterior mediastinal mass. Small scattered mediastinal lymph nodes, none pathologically enlarged or changed since prior study. Heart is normal size. Calcifications throughout the aorta without aneurysm. Calcified coronary arteries.  Scarring in the left apex is stable. Mild bibasilar scarring also stable. No confluent airspace opacities or effusions. No pulmonary nodules or masses.  Chest wall soft tissues are unremarkable. Imaging into the upper abdomen shows no acute findings. No acute bony abnormality.  IMPRESSION: No evidence of thymoma recurrence.  No acute findings.   Electronically Signed   By: Rolm Baptise M.D.   On: 10/01/2014 13:45  Ct Chest Wo Contrast  09/04/2013   CLINICAL DATA:  History of resection of thymoma in 2011, follow-up  EXAM: CT CHEST WITHOUT CONTRAST  TECHNIQUE: Multidetector CT imaging of the chest was performed following the standard protocol without IV contrast.  COMPARISON:  CT chest of 07/04/2012  FINDINGS: Apparent pleural parenchymal scarring particularly in the left lung apex is stable with 2 tiny nodules which appear stable as well in the left apex. No  new parenchymal lesion is seen. No enlarging nodule is noted. There is no evidence of pleural effusion. The central airway is patent.  On soft tissue window images, the thyroid gland is unremarkable. On this unenhanced study, of atheromatous changes are noted throughout the entire thoracic aorta. No mediastinal or hilar adenopathy is seen. Faint Coronary artery calcification is noted, and median sternotomy sutures are noted from prior thymoma resection. No abnormal soft tissue in the anterior mediastinum is seen. No bony abnormality is seen. The upper abdomen is unremarkable with prior cholecystectomy .  IMPRESSION: No evidence of recurrence of thymoma. No adenopathy.   Electronically Signed    By: Ivar Drape M.D.   On: 09/04/2013 13:00      Recent Labs: Lab Results  Component Value Date   WBC 6.1 09/28/2011   HGB 15.8 09/28/2011   HCT 45.9 09/28/2011   PLT 241 09/28/2011   GLUCOSE 93 09/28/2011   CHOL  08/08/2010    113 (NOTE) ATP III Classification:      < 200        mg/dL        Desirable     200 - 239     mg/dL        Borderline High     >= 240        mg/dL        High    TRIG 130 08/08/2010   ALT 25 09/28/2011   AST 22 09/28/2011   NA 140 09/28/2011   K 4.5 09/28/2011   CL 104 09/28/2011   CREATININE 0.94 06/20/2012   BUN 13 06/20/2012   CO2 28 09/28/2011   INR 0.99 10/13/2010      Assessment / Plan:    No evidence of recurrence of thymoma Return one year with CT scan of the chest for follow-up of thymoma resection  Grace Isaac MD 10/01/2014 3:25 PM

## 2015-03-18 ENCOUNTER — Other Ambulatory Visit: Payer: Self-pay | Admitting: Internal Medicine

## 2015-06-12 ENCOUNTER — Other Ambulatory Visit: Payer: Self-pay | Admitting: Internal Medicine

## 2015-08-22 ENCOUNTER — Encounter: Payer: Self-pay | Admitting: *Deleted

## 2015-08-22 DIAGNOSIS — Z0000 Encounter for general adult medical examination without abnormal findings: Secondary | ICD-10-CM

## 2015-08-24 NOTE — Congregational Nurse Program (Unsigned)
Congregational Nurse Program Note  Date of Encounter: 08/22/2015  Past Medical History: No past medical history on file.  Encounter Details:  Flu shot given

## 2015-09-17 ENCOUNTER — Other Ambulatory Visit: Payer: Self-pay | Admitting: Family Medicine

## 2015-09-17 ENCOUNTER — Ambulatory Visit
Admission: RE | Admit: 2015-09-17 | Discharge: 2015-09-17 | Disposition: A | Discharge status: Home / Self Care | Payer: Medicare Other | Source: Ambulatory Visit | Attending: Family Medicine | Admitting: Family Medicine

## 2015-09-17 ENCOUNTER — Other Ambulatory Visit: Payer: Self-pay | Admitting: *Deleted

## 2015-09-17 DIAGNOSIS — D4989 Neoplasm of unspecified behavior of other specified sites: Secondary | ICD-10-CM

## 2015-09-17 DIAGNOSIS — M542 Cervicalgia: Secondary | ICD-10-CM

## 2015-09-17 DIAGNOSIS — M25511 Pain in right shoulder: Secondary | ICD-10-CM

## 2015-09-17 DIAGNOSIS — D150 Benign neoplasm of thymus: Principal | ICD-10-CM

## 2015-09-29 ENCOUNTER — Other Ambulatory Visit: Payer: Self-pay | Admitting: *Deleted

## 2015-09-29 DIAGNOSIS — D150 Benign neoplasm of thymus: Principal | ICD-10-CM

## 2015-09-29 DIAGNOSIS — D4989 Neoplasm of unspecified behavior of other specified sites: Secondary | ICD-10-CM

## 2015-10-19 ENCOUNTER — Ambulatory Visit (INDEPENDENT_AMBULATORY_CARE_PROVIDER_SITE_OTHER): Payer: Medicare Other | Admitting: Cardiothoracic Surgery

## 2015-10-19 ENCOUNTER — Ambulatory Visit
Admission: RE | Admit: 2015-10-19 | Discharge: 2015-10-19 | Disposition: A | Discharge status: Home / Self Care | Payer: Medicare Other | Source: Ambulatory Visit | Attending: Cardiothoracic Surgery | Admitting: Cardiothoracic Surgery

## 2015-10-19 ENCOUNTER — Encounter: Payer: Self-pay | Admitting: Cardiothoracic Surgery

## 2015-10-19 VITALS — BP 135/90 | HR 100 | Resp 20 | Ht 66.5 in | Wt 160.0 lb

## 2015-10-19 DIAGNOSIS — D150 Benign neoplasm of thymus: Secondary | ICD-10-CM

## 2015-10-19 DIAGNOSIS — D4989 Neoplasm of unspecified behavior of other specified sites: Secondary | ICD-10-CM

## 2015-10-19 NOTE — Progress Notes (Signed)
[address]       [place] [address]             [id number]        Reshawn Radel Kasson Medical Record #[id number] Date of Birth: February 01, 1941  Eppie Gibson, MD Alessandra Grout, MD  Chief Complaint:    Follow Up Visit Thymoma resection Dec 2011   History of Present Illness:     Patient returns for followup visit after resection of a 7 x 5.5 x 5 cm thymoma. .  He has no complaints of chest or abdominal pain. No palpable nodes. No SOB.  He is now 5 years following resection without evidence of recurrence.   History  Smoking status  . Former Smoker  . Types: Cigarettes  . Quit date: 10/30/1988  Smokeless tobacco  . Never Used    Past Surgical History  Procedure Laterality Date  . Sternotomy  08/2010    for thymoma resection  . Cholecystectomy       No Known Allergies  Current Outpatient Prescriptions  Medication Sig Dispense Refill  . finasteride (PROSCAR) 5 MG tablet Take 5 mg by mouth daily.      Marland Kitchen lisinopril-hydrochlorothiazide (PRINZIDE,ZESTORETIC) 20-12.5 MG per tablet Take 1 tablet by mouth daily.      Marland Kitchen omeprazole (PRILOSEC) 40 MG capsule TAKE ONE CAPSULE BY MOUTH EVERY DAY 90 capsule 0   No current facility-administered medications for this visit.       Physical Exam: BP 135/90 mmHg  Pulse 100  Resp 20  Ht 5' 6.5" (1.689 m)  Wt 160 lb (72.576 kg)  BMI 25.44 kg/m2  SpO2 98%  General appearance: alert, cooperative and no distress Neurologic: intact Heart: regular rate and rhythm, S1, S2 normal, no murmur, click, rub or gallop Lungs: clear to auscultation bilaterally Abdomen: soft, non-tender; bowel sounds normal; no masses,  no organomegaly Extremities: extremities normal, atraumatic, no cyanosis or edema Wound: sternum stable No cervical adenopathy   Diagnostic Studies & Laboratory data:         Recent Radiology Findings: Ct Chest Wo Contrast  10/19/2015  CLINICAL DATA:  Followup  thymoma status post resection. EXAM: CT CHEST WITHOUT CONTRAST TECHNIQUE: Multidetector CT imaging of the chest was performed following the standard protocol without IV contrast. COMPARISON:  10/01/2014 FINDINGS: Mediastinum: Heart size is normal. Previous median sternotomy. Aortic atherosclerosis noted. Calcification within the LAD coronary artery noted. No mediastinal or hilar adenopathy identified. No evidence for recurrent mass within the anterior mediastinum. The trachea is patent and midline. The esophagus appears normal. Lungs/Pleura: No pleural fluid. Similar appearance of scar like density within the posterior left apex. There is a small nodule in the left apex which measures 3 mm and is unchanged from 07/04/2012 compatible with a benign process, image 14 of series 4. Upper Abdomen: No suspicious liver abnormality. The adrenal glands are both normal. The visualized portions of the pancreas and spleen appear normal. Musculoskeletal: No aggressive lytic or sclerotic bone lesions identified. IMPRESSION: 1. Stable CT of the chest.  No evidence for recurrence of tumor. 2. Aortic atherosclerosis and coronary artery calcification. 3. Stable scar and nodule in the left apex. Electronically Signed   By: Kerby Moors M.D.   On: 10/19/2015 15:43   I have independently reviewed the above radiology studies  and reviewed the findings with the patient.  Ct Chest Wo Contrast  09/04/2013   CLINICAL DATA:  History of resection of thymoma in 2011, follow-up  EXAM: CT CHEST WITHOUT CONTRAST  TECHNIQUE: Multidetector CT imaging of the chest was performed following the standard protocol without IV contrast.  COMPARISON:  CT chest of 07/04/2012  FINDINGS: Apparent pleural parenchymal scarring particularly in the left lung apex is stable with 2 tiny nodules which appear stable as well in the left apex. No new parenchymal lesion is seen. No enlarging nodule is noted. There is no evidence of pleural effusion. The central  airway is patent.  On soft tissue window images, the thyroid gland is unremarkable. On this unenhanced study, of atheromatous changes are noted throughout the entire thoracic aorta. No mediastinal or hilar adenopathy is seen. Faint Coronary artery calcification is noted, and median sternotomy sutures are noted from prior thymoma resection. No abnormal soft tissue in the anterior mediastinum is seen. No bony abnormality is seen. The upper abdomen is unremarkable with prior cholecystectomy .  IMPRESSION: No evidence of recurrence of thymoma. No adenopathy.   Electronically Signed   By: Ivar Drape M.D.   On: 09/04/2013 13:00      Recent Labs: Lab Results  Component Value Date   WBC 6.1 09/28/2011   HGB 15.8 09/28/2011   HCT 45.9 09/28/2011   PLT 241 09/28/2011   GLUCOSE 93 09/28/2011   CHOL  08/08/2010    113 (NOTE) ATP III Classification:      < 200        mg/dL        Desirable     200 - 239     mg/dL        Borderline High     >= 240        mg/dL        High    TRIG 130 08/08/2010   ALT 25 09/28/2011   AST 22 09/28/2011   NA 140 09/28/2011   K 4.5 09/28/2011   CL 104 09/28/2011   CREATININE 0.94 06/20/2012   BUN 13 06/20/2012   CO2 28 09/28/2011   INR 0.99 10/13/2010      Assessment / Plan:    No evidence of recurrence of thymoma now 5 years post resection-  without evidence of recurrence of thymoma. I will not plan on any further CT scans of the chest and less he has new complaints or signs. We'll plan to see him back as needed    Grace Isaac MD      [address] [place] [address] Office [phone]   Beeper [phone]  10/19/2015 4:10 PM

## 2017-11-12 DIAGNOSIS — H524 Presbyopia: Secondary | ICD-10-CM | POA: Diagnosis not present

## 2017-11-19 DIAGNOSIS — I10 Essential (primary) hypertension: Secondary | ICD-10-CM | POA: Diagnosis not present

## 2017-11-19 DIAGNOSIS — E7800 Pure hypercholesterolemia, unspecified: Secondary | ICD-10-CM | POA: Diagnosis not present

## 2017-11-22 DIAGNOSIS — H353122 Nonexudative age-related macular degeneration, left eye, intermediate dry stage: Secondary | ICD-10-CM | POA: Diagnosis not present

## 2017-11-22 DIAGNOSIS — H353211 Exudative age-related macular degeneration, right eye, with active choroidal neovascularization: Secondary | ICD-10-CM | POA: Diagnosis not present

## 2018-01-03 DIAGNOSIS — H2511 Age-related nuclear cataract, right eye: Secondary | ICD-10-CM | POA: Diagnosis not present

## 2018-01-03 DIAGNOSIS — H25811 Combined forms of age-related cataract, right eye: Secondary | ICD-10-CM | POA: Diagnosis not present

## 2018-01-15 DIAGNOSIS — H353211 Exudative age-related macular degeneration, right eye, with active choroidal neovascularization: Secondary | ICD-10-CM | POA: Diagnosis not present

## 2018-01-15 DIAGNOSIS — H353122 Nonexudative age-related macular degeneration, left eye, intermediate dry stage: Secondary | ICD-10-CM | POA: Diagnosis not present

## 2018-03-20 ENCOUNTER — Encounter: Payer: Self-pay | Admitting: Internal Medicine

## 2018-06-07 DIAGNOSIS — M722 Plantar fascial fibromatosis: Secondary | ICD-10-CM | POA: Diagnosis not present

## 2018-06-25 DIAGNOSIS — I2510 Atherosclerotic heart disease of native coronary artery without angina pectoris: Secondary | ICD-10-CM | POA: Diagnosis not present

## 2018-06-25 DIAGNOSIS — M4722 Other spondylosis with radiculopathy, cervical region: Secondary | ICD-10-CM | POA: Diagnosis not present

## 2018-06-25 DIAGNOSIS — N401 Enlarged prostate with lower urinary tract symptoms: Secondary | ICD-10-CM | POA: Diagnosis not present

## 2018-06-25 DIAGNOSIS — Z79899 Other long term (current) drug therapy: Secondary | ICD-10-CM | POA: Diagnosis not present

## 2018-06-25 DIAGNOSIS — D692 Other nonthrombocytopenic purpura: Secondary | ICD-10-CM | POA: Diagnosis not present

## 2018-06-25 DIAGNOSIS — E7800 Pure hypercholesterolemia, unspecified: Secondary | ICD-10-CM | POA: Diagnosis not present

## 2018-06-25 DIAGNOSIS — Z0000 Encounter for general adult medical examination without abnormal findings: Secondary | ICD-10-CM | POA: Diagnosis not present

## 2018-06-25 DIAGNOSIS — Z23 Encounter for immunization: Secondary | ICD-10-CM | POA: Diagnosis not present

## 2018-06-25 DIAGNOSIS — Z85238 Personal history of other malignant neoplasm of thymus: Secondary | ICD-10-CM | POA: Diagnosis not present

## 2018-07-08 DIAGNOSIS — Z1211 Encounter for screening for malignant neoplasm of colon: Secondary | ICD-10-CM | POA: Diagnosis not present

## 2018-11-21 DIAGNOSIS — H524 Presbyopia: Secondary | ICD-10-CM | POA: Diagnosis not present

## 2018-12-27 DIAGNOSIS — I10 Essential (primary) hypertension: Secondary | ICD-10-CM | POA: Diagnosis not present

## 2018-12-27 DIAGNOSIS — E7800 Pure hypercholesterolemia, unspecified: Secondary | ICD-10-CM | POA: Diagnosis not present

## 2018-12-27 DIAGNOSIS — R7301 Impaired fasting glucose: Secondary | ICD-10-CM | POA: Diagnosis not present

## 2018-12-31 DIAGNOSIS — R7301 Impaired fasting glucose: Secondary | ICD-10-CM | POA: Diagnosis not present

## 2018-12-31 DIAGNOSIS — I10 Essential (primary) hypertension: Secondary | ICD-10-CM | POA: Diagnosis not present

## 2018-12-31 DIAGNOSIS — E7800 Pure hypercholesterolemia, unspecified: Secondary | ICD-10-CM | POA: Diagnosis not present

## 2019-08-26 DIAGNOSIS — I700 Atherosclerosis of aorta: Secondary | ICD-10-CM | POA: Diagnosis not present

## 2019-08-26 DIAGNOSIS — Z23 Encounter for immunization: Secondary | ICD-10-CM | POA: Diagnosis not present

## 2019-08-26 DIAGNOSIS — D692 Other nonthrombocytopenic purpura: Secondary | ICD-10-CM | POA: Diagnosis not present

## 2019-08-26 DIAGNOSIS — Z79899 Other long term (current) drug therapy: Secondary | ICD-10-CM | POA: Diagnosis not present

## 2019-08-26 DIAGNOSIS — R7301 Impaired fasting glucose: Secondary | ICD-10-CM | POA: Diagnosis not present

## 2019-08-26 DIAGNOSIS — I2510 Atherosclerotic heart disease of native coronary artery without angina pectoris: Secondary | ICD-10-CM | POA: Diagnosis not present

## 2019-08-26 DIAGNOSIS — N401 Enlarged prostate with lower urinary tract symptoms: Secondary | ICD-10-CM | POA: Diagnosis not present

## 2019-08-26 DIAGNOSIS — M4722 Other spondylosis with radiculopathy, cervical region: Secondary | ICD-10-CM | POA: Diagnosis not present

## 2019-08-26 DIAGNOSIS — E785 Hyperlipidemia, unspecified: Secondary | ICD-10-CM | POA: Diagnosis not present

## 2019-08-26 DIAGNOSIS — Z0000 Encounter for general adult medical examination without abnormal findings: Secondary | ICD-10-CM | POA: Diagnosis not present

## 2020-02-24 DIAGNOSIS — E785 Hyperlipidemia, unspecified: Secondary | ICD-10-CM | POA: Diagnosis not present

## 2020-02-24 DIAGNOSIS — M5416 Radiculopathy, lumbar region: Secondary | ICD-10-CM | POA: Diagnosis not present

## 2020-02-24 DIAGNOSIS — R7301 Impaired fasting glucose: Secondary | ICD-10-CM | POA: Diagnosis not present

## 2020-02-24 DIAGNOSIS — I10 Essential (primary) hypertension: Secondary | ICD-10-CM | POA: Diagnosis not present

## 2020-02-25 DIAGNOSIS — M5416 Radiculopathy, lumbar region: Secondary | ICD-10-CM | POA: Diagnosis not present

## 2020-02-25 DIAGNOSIS — M25552 Pain in left hip: Secondary | ICD-10-CM | POA: Diagnosis not present

## 2020-02-25 DIAGNOSIS — M25551 Pain in right hip: Secondary | ICD-10-CM | POA: Diagnosis not present

## 2020-03-03 DIAGNOSIS — M5416 Radiculopathy, lumbar region: Secondary | ICD-10-CM | POA: Diagnosis not present

## 2020-03-30 DIAGNOSIS — M5416 Radiculopathy, lumbar region: Secondary | ICD-10-CM | POA: Diagnosis not present

## 2020-04-13 DIAGNOSIS — M545 Low back pain: Secondary | ICD-10-CM | POA: Diagnosis not present

## 2020-04-13 DIAGNOSIS — M5416 Radiculopathy, lumbar region: Secondary | ICD-10-CM | POA: Diagnosis not present

## 2020-04-26 DIAGNOSIS — M5416 Radiculopathy, lumbar region: Secondary | ICD-10-CM | POA: Diagnosis not present

## 2020-05-06 DIAGNOSIS — M5416 Radiculopathy, lumbar region: Secondary | ICD-10-CM | POA: Diagnosis not present

## 2020-05-06 DIAGNOSIS — M48062 Spinal stenosis, lumbar region with neurogenic claudication: Secondary | ICD-10-CM | POA: Diagnosis not present

## 2020-05-19 DIAGNOSIS — M7138 Other bursal cyst, other site: Secondary | ICD-10-CM | POA: Diagnosis not present

## 2020-05-19 DIAGNOSIS — M545 Low back pain: Secondary | ICD-10-CM | POA: Diagnosis not present

## 2020-05-19 DIAGNOSIS — M48061 Spinal stenosis, lumbar region without neurogenic claudication: Secondary | ICD-10-CM | POA: Diagnosis not present

## 2020-05-27 DIAGNOSIS — L309 Dermatitis, unspecified: Secondary | ICD-10-CM | POA: Diagnosis not present

## 2020-05-27 DIAGNOSIS — I10 Essential (primary) hypertension: Secondary | ICD-10-CM | POA: Diagnosis not present

## 2020-05-27 DIAGNOSIS — M5416 Radiculopathy, lumbar region: Secondary | ICD-10-CM | POA: Diagnosis not present

## 2020-05-27 DIAGNOSIS — R7303 Prediabetes: Secondary | ICD-10-CM | POA: Diagnosis not present

## 2020-05-27 DIAGNOSIS — Z01818 Encounter for other preprocedural examination: Secondary | ICD-10-CM | POA: Diagnosis not present

## 2020-06-21 ENCOUNTER — Ambulatory Visit: Payer: Self-pay | Admitting: Orthopedic Surgery

## 2020-06-28 ENCOUNTER — Encounter (HOSPITAL_COMMUNITY): Payer: Self-pay

## 2020-06-28 NOTE — Progress Notes (Signed)
Your procedure is scheduled on Thursday, September 2nd.  Report to [facility] Main Entrance "A" at 5:30 A.M., and check in at the Admitting office.  Call this number if you have problems the morning of surgery:  [phone]  Call [phone] if you have any questions prior to your surgery date Monday-Friday 8am-4pm   Remember:  Do not eat after midnight the night before your surgery  You may drink clear liquids until 4:30 A,M. the morning of your surgery.   Clear liquids allowed are: Water, Non-Citrus Juices (without pulp), Carbonated Beverages, Clear Tea, Black Coffee Only, and Gatorade  Enhanced Recovery after Surgery for Orthopedics Enhanced Recovery after Surgery is a protocol used to improve the stress on your body and your recovery after surgery.  Patient Instructions  . The night before surgery:  o No food after midnight. ONLY clear liquids after midnight  .  Marland Kitchen The day of surgery (if you do NOT have diabetes):  o Drink ONE (1) Pre-Surgery Clear Ensure by 4:30 A.M. the morning of surgery.  o This drink was given to you during your hospital  pre-op appointment visit. o Nothing else to drink after completing the  Pre-Surgery Clear Ensure.         If you have questions, please contact your surgeon's office.   Take these medicines the morning of surgery with A SIP OF WATER: NONE  As of today, STOP taking any Aspirin (unless otherwise instructed by your surgeon) Aleve, Naproxen, Ibuprofen, Motrin, Advil, Goody's, BC's, all herbal medications, fish oil, and all vitamins.                     Do not wear jewelry.            Do not wear lotions, powders, colognes, or deodorant.             Men may shave face and neck.            Do not bring valuables to the hospital.            [facility] is not responsible for any belongings or valuables.  Do NOT Smoke (Tobacco/Vaping) or drink Alcohol 24 hours prior to your procedure If you use a CPAP at night, you may bring all  equipment for your overnight stay.   Contacts, glasses, dentures or bridgework may not be worn into surgery.      For patients admitted to the hospital, discharge time will be determined by your treatment team.   Patients discharged the day of surgery will not be allowed to drive home, and someone needs to stay with them for 24 hours.  Special instructions:   [place]- Preparing For Surgery  Before surgery, you can play an important role. Because skin is not sterile, your skin needs to be as free of germs as possible. You can reduce the number of germs on your skin by washing with CHG (chlorahexidine gluconate) Soap before surgery.  CHG is an antiseptic cleaner which kills germs and bonds with the skin to continue killing germs even after washing.    Oral Hygiene is also important to reduce your risk of infection.  Remember - BRUSH YOUR TEETH THE MORNING OF SURGERY WITH YOUR REGULAR TOOTHPASTE  Please do not use if you have an allergy to CHG or antibacterial soaps. If your skin becomes reddened/irritated stop using the CHG.  Do not shave (including legs and underarms) for at least 48 hours prior to first CHG  shower. It is OK to shave your face.  Please follow these instructions carefully.   1. Shower the NIGHT BEFORE SURGERY and the MORNING OF SURGERY with CHG Soap.   2. If you chose to wash your hair, wash your hair first as usual with your normal shampoo.  3. After you shampoo, rinse your hair and body thoroughly to remove the shampoo.  4. Use CHG as you would any other liquid soap. You can apply CHG directly to the skin and wash gently with a scrungie or a clean washcloth.   5. Apply the CHG Soap to your body ONLY FROM THE NECK DOWN.  Do not use on open wounds or open sores. Avoid contact with your eyes, ears, mouth and genitals (private parts). Wash Face and genitals (private parts)  with your normal soap.   6. Wash thoroughly, paying special attention to the area where your  surgery will be performed.  7. Thoroughly rinse your body with warm water from the neck down.  8. DO NOT shower/wash with your normal soap after using and rinsing off the CHG Soap.  9. Pat yourself dry with a CLEAN TOWEL.  10. Wear CLEAN PAJAMAS to bed the night before surgery  11. Place CLEAN SHEETS on your bed the night of your first shower and DO NOT SLEEP WITH PETS.  Day of Surgery: Wear Clean/Comfortable clothing the morning of surgery Do not apply any deodorants/lotions.   Remember to brush your teeth WITH YOUR REGULAR TOOTHPASTE.   Please read over the following fact sheets that you were given.

## 2020-06-29 ENCOUNTER — Encounter (HOSPITAL_COMMUNITY)
Admission: RE | Admit: 2020-06-29 | Discharge: 2020-06-29 | Disposition: A | Discharge status: Home / Self Care | Payer: Medicare HMO | Source: Ambulatory Visit | Attending: Orthopedic Surgery | Admitting: Orthopedic Surgery

## 2020-06-29 ENCOUNTER — Encounter (HOSPITAL_COMMUNITY): Payer: Self-pay | Admitting: Specialist

## 2020-06-29 ENCOUNTER — Other Ambulatory Visit: Payer: Self-pay

## 2020-06-29 ENCOUNTER — Encounter (HOSPITAL_COMMUNITY): Payer: Self-pay

## 2020-06-29 ENCOUNTER — Other Ambulatory Visit (HOSPITAL_COMMUNITY)
Admission: RE | Admit: 2020-06-29 | Discharge: 2020-06-29 | Disposition: A | Discharge status: Home / Self Care | Payer: Medicare HMO | Source: Ambulatory Visit | Attending: Specialist | Admitting: Specialist

## 2020-06-29 ENCOUNTER — Encounter (HOSPITAL_COMMUNITY)
Admission: RE | Admit: 2020-06-29 | Discharge: 2020-06-29 | Disposition: A | Discharge status: Home / Self Care | Payer: Medicare HMO | Source: Ambulatory Visit | Attending: Specialist | Admitting: Specialist

## 2020-06-29 DIAGNOSIS — E785 Hyperlipidemia, unspecified: Secondary | ICD-10-CM | POA: Insufficient documentation

## 2020-06-29 DIAGNOSIS — M48061 Spinal stenosis, lumbar region without neurogenic claudication: Secondary | ICD-10-CM | POA: Insufficient documentation

## 2020-06-29 DIAGNOSIS — M47816 Spondylosis without myelopathy or radiculopathy, lumbar region: Secondary | ICD-10-CM | POA: Insufficient documentation

## 2020-06-29 DIAGNOSIS — Z01818 Encounter for other preprocedural examination: Secondary | ICD-10-CM | POA: Insufficient documentation

## 2020-06-29 DIAGNOSIS — Z79899 Other long term (current) drug therapy: Secondary | ICD-10-CM | POA: Diagnosis not present

## 2020-06-29 DIAGNOSIS — M2578 Osteophyte, vertebrae: Secondary | ICD-10-CM | POA: Diagnosis not present

## 2020-06-29 DIAGNOSIS — I10 Essential (primary) hypertension: Secondary | ICD-10-CM | POA: Insufficient documentation

## 2020-06-29 DIAGNOSIS — Z20822 Contact with and (suspected) exposure to covid-19: Secondary | ICD-10-CM | POA: Insufficient documentation

## 2020-06-29 DIAGNOSIS — Z87891 Personal history of nicotine dependence: Secondary | ICD-10-CM | POA: Diagnosis not present

## 2020-06-29 DIAGNOSIS — M5126 Other intervertebral disc displacement, lumbar region: Secondary | ICD-10-CM

## 2020-06-29 DIAGNOSIS — I700 Atherosclerosis of aorta: Secondary | ICD-10-CM | POA: Diagnosis not present

## 2020-06-29 LAB — BASIC METABOLIC PANEL
Anion gap: 9 (ref 5–15)
BUN: 13 mg/dL (ref 8–23)
CO2: 29 mmol/L (ref 22–32)
Calcium: 9.4 mg/dL (ref 8.9–10.3)
Chloride: 101 mmol/L (ref 98–111)
Creatinine, Ser: 1.02 mg/dL (ref 0.61–1.24)
GFR calc Af Amer: >60 mL/min (ref >60)
GFR calc non Af Amer: >60 mL/min (ref >60)
Glucose, Bld: 165 mg/dL — ABNORMAL HIGH (ref 70–99)
Potassium: 3.6 mmol/L (ref 3.5–5.1)
Sodium: 139 mmol/L (ref 135–145)

## 2020-06-29 LAB — CBC
HCT: 46.3 % (ref 39.0–52.0)
Hemoglobin: 15.7 g/dL (ref 13.0–17.0)
MCH: 34 pg (ref 26.0–34.0)
MCHC: 33.9 g/dL (ref 30.0–36.0)
MCV: 100.2 fL — ABNORMAL HIGH (ref 80.0–100.0)
Platelets: 279 10*3/uL (ref 150–400)
RBC: 4.62 MIL/uL (ref 4.22–5.81)
RDW: 13 % (ref 11.5–15.5)
WBC: 5.8 10*3/uL (ref 4.0–10.5)
nRBC: 0 % (ref 0.0–0.2)

## 2020-06-29 LAB — SARS CORONAVIRUS 2 (TAT 6-24 HRS): SARS Coronavirus 2: NEGATIVE

## 2020-06-29 LAB — SURGICAL PCR SCREEN
MRSA, PCR: NEGATIVE
Staphylococcus aureus: NEGATIVE

## 2020-06-29 NOTE — Anesthesia Preprocedure Evaluation (Addendum)
Anesthesia Evaluation  Patient identified by MRN, date of birth, ID band Patient awake    Reviewed: Allergy & Precautions, NPO status , Patient's Chart, lab work & pertinent test results  Airway Mallampati: II  TM Distance: >3 FB Neck ROM: Full    Dental  (+) Lower Dentures, Upper Dentures   Pulmonary former smoker,    Pulmonary exam normal breath sounds clear to auscultation       Cardiovascular hypertension, Pt. 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  Renal/GU negative Renal ROS     Musculoskeletal negative musculoskeletal ROS (+)   Abdominal   Peds  Hematology negative hematology ROS (+)   Anesthesia Other Findings Day of surgery medications reviewed with the patient.  Reproductive/Obstetrics                           Anesthesia Physical Anesthesia Plan  ASA: II  Anesthesia Plan: General   Post-op Pain Management:    Induction: Intravenous  PONV Risk Score and Plan: 2 and Dexamethasone and Ondansetron  Airway Management Planned: Oral ETT  Additional Equipment:   Intra-op Plan:   Post-operative Plan: Extubation in OR  Informed Consent: I have reviewed the patients History and Physical, chart, labs and discussed the procedure including the risks, benefits and alternatives for the proposed anesthesia with the patient or authorized representative who has indicated his/her understanding and acceptance.       Plan Discussed with: CRNA  Anesthesia Plan Comments: (PAT note written 06/29/2020 by Myra Gianotti, PA-C. )       Anesthesia Quick Evaluation

## 2020-06-29 NOTE — Progress Notes (Signed)
COVID Vaccine Completed: Date COVID Vaccine completed: COVID vaccine manufacturer: Pfizer  11/21/19, 12/12/19, 06/28/20  [name]   PCP - Alessandra Grout, MD Cardiologist - N/A  Chest x-ray -  EKG - 06/29/20 Stress Test -  ECHO -  Cardiac Cath -   Sleep Study -  CPAP -   Fasting Blood Sugar -  Checks Blood Sugar _____ times a day  Blood Thinner Instructions: Aspirin Instructions: Last Dose:  ADL w/o SOB  Anesthesia review:   Patient denies shortness of breath, fever, cough and chest pain at PAT appointment   Patient verbalized understanding of instructions that were given to them at the PAT appointment. Patient was also instructed that they will need to review over the PAT instructions again at home before surgery.

## 2020-06-29 NOTE — Progress Notes (Signed)
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 Reportedly, patient arrived for his [facility] PAT RN visit, but then learned case was moved to [facility]. He did have a lab draw, nasal PCR, L-spine xray, and EKG done prior to leaving [facility] PAT.   History includes former smoker (quit 10/30/88), HTN, HLD, gallstone/necrotizing pancreatitis (06/2010; s/p cholecystectomy 01/16/11), thymoma (s/p sternotomy with excision or right mediastinal mass 10/17/10; pathology: Type AB Thymoma).  There is a medical clearance note from PCP Dr. Stephanie Acre scanned under Media tab (06/17/20) with recommendations to monitor BP. Date examined listed as 05/27/20, although the note was not scanned in.   06/29/20 presurgical COVID-19 test and lumbar xray is still in process. Per PST RN documentation, [place] COVID-19 vaccine 06/28/20. Anesthesia team to evaluate on the day of surgery.   VS: BP 130/90   Pulse (!) (P) 101   Temp (P) 36.8 C (Oral)   Resp (P) 18   Ht (P) 5\' 7"  (1.702 m)   Wt (P) 74.5 kg   SpO2 (P) 94%   BMI (P) 25.73 kg/m    PROVIDERS: Jonathon Jordan, MD is PCP  - Lanelle Bal, MD is CT surgeon. Last visit 10/19/15 for five year follow-up from thymoma resection. No evidence of recurrence, so as needed imaging and CT surgery follow-up advised.    LABS: Labs reviewed: Acceptable for surgery. (all labs ordered are listed, but only abnormal results are displayed)  Labs Reviewed  BASIC METABOLIC PANEL - Abnormal; Notable for the following components:      Result Value   Glucose, Bld 165 (*)    All other components within normal limits  CBC  - Abnormal; Notable for the following components:   MCV 100.2 (*)    All other components within normal limits  SURGICAL PCR SCREEN    IMAGES: Xray 2-3V L-spine 06/29/20: In process.   EKG: 06/29/20: Normal sinus rhythm Left axis deviation Incomplete right bundle branch block Minimal voltage criteria for LVH, may be normal variant ( R in aVL ) Abnormal ECG - He has comparison EKGs in [facility]. Overall, his current EKG appears similar to 10/17/10 and 08/03/06 tracings that also show NSR, LAD, incomplete RBBB, minimal voltage criteria for LVH--along with non-specific T wave abnormality.   CV: Echo 07/22/10: Study Conclusions  Left ventricle: The cavity size was normal. Systolic function was  normal. Wall motion was normal; there were no regional wall motion  abnormalities.    Past Medical History:  Diagnosis Date  . Hyperlipidemia   . Hypertension   . Pancreatitis 06/2010   gallstone/necrotizing pancreatitis 06/2010  . Thymoma, malignant     Past Surgical History:  Procedure Laterality Date  . CHOLECYSTECTOMY  01/16/2011  . STERNOTOMY  10/17/2010   for thymoma resection    MEDICATIONS: . diphenhydrAMINE (BENADRYL) 25 MG tablet  . finasteride (PROSCAR) 5 MG tablet  . lisinopril-hydrochlorothiazide (PRINZIDE,ZESTORETIC) 20-12.5 MG per tablet  . lovastatin (MEVACOR) 40 MG tablet  . omeprazole (PRILOSEC) 40 MG capsule   No current facility-administered medications for this encounter.    Myra Gianotti, PA-C  Surgical Short Stay/Anesthesiology [facility] Phone [phone] [facility] Phone [phone] 06/29/2020 3:41 PM

## 2020-06-29 NOTE — Progress Notes (Signed)
Surgery switched to [facility]. Per AD, labs to be done [facility]. Patient scheduled for COVID-19 testing today 06/29/2020. Patient told to go to drive-thru site after visit. Address and directions for site given to patient. Patient also made aware that a pre-op nurse from [facility] will give him a call tonight for pre-op/procedure instructions, patient verbalized understanding. Patient verbalized understanding of self-quarantine instructions, appointment time and place.

## 2020-06-30 ENCOUNTER — Ambulatory Visit (HOSPITAL_COMMUNITY)
Admission: RE | Admit: 2020-06-30 | Discharge: 2020-06-30 | Disposition: A | Discharge status: Home / Self Care | Payer: Medicare HMO | Attending: Specialist | Admitting: Specialist

## 2020-06-30 ENCOUNTER — Ambulatory Visit (HOSPITAL_COMMUNITY): Payer: Medicare HMO

## 2020-06-30 ENCOUNTER — Ambulatory Visit (HOSPITAL_COMMUNITY): Payer: Medicare HMO | Admitting: Vascular Surgery

## 2020-06-30 ENCOUNTER — Encounter (HOSPITAL_COMMUNITY)
Admission: RE | Disposition: A | Discharge status: Home / Self Care | Payer: Self-pay | Source: Home / Self Care | Attending: Specialist

## 2020-06-30 ENCOUNTER — Encounter (HOSPITAL_COMMUNITY): Payer: Self-pay | Admitting: Specialist

## 2020-06-30 DIAGNOSIS — Z87891 Personal history of nicotine dependence: Secondary | ICD-10-CM | POA: Diagnosis not present

## 2020-06-30 DIAGNOSIS — M7138 Other bursal cyst, other site: Secondary | ICD-10-CM | POA: Insufficient documentation

## 2020-06-30 DIAGNOSIS — E785 Hyperlipidemia, unspecified: Secondary | ICD-10-CM | POA: Insufficient documentation

## 2020-06-30 DIAGNOSIS — Z419 Encounter for procedure for purposes other than remedying health state, unspecified: Secondary | ICD-10-CM

## 2020-06-30 DIAGNOSIS — M48062 Spinal stenosis, lumbar region with neurogenic claudication: Secondary | ICD-10-CM | POA: Insufficient documentation

## 2020-06-30 DIAGNOSIS — Z981 Arthrodesis status: Secondary | ICD-10-CM | POA: Diagnosis not present

## 2020-06-30 DIAGNOSIS — Z79899 Other long term (current) drug therapy: Secondary | ICD-10-CM | POA: Diagnosis not present

## 2020-06-30 DIAGNOSIS — M48061 Spinal stenosis, lumbar region without neurogenic claudication: Secondary | ICD-10-CM | POA: Diagnosis not present

## 2020-06-30 DIAGNOSIS — I10 Essential (primary) hypertension: Secondary | ICD-10-CM | POA: Diagnosis not present

## 2020-06-30 HISTORY — PX: LUMBAR LAMINECTOMY/DECOMPRESSION MICRODISCECTOMY: SHX5026

## 2020-06-30 SURGERY — LUMBAR LAMINECTOMY/DECOMPRESSION MICRODISCECTOMY 1 LEVEL
Anesthesia: General

## 2020-06-30 MED ORDER — ROCURONIUM BROMIDE 10 MG/ML (PF) SYRINGE
PREFILLED_SYRINGE | INTRAVENOUS | Status: DC | PRN
  Administered 2020-06-30: 5 mg via INTRAVENOUS
  Administered 2020-06-30: 60 mg via INTRAVENOUS
  Administered 2020-06-30: 10 mg via INTRAVENOUS

## 2020-06-30 MED ORDER — CEFAZOLIN SODIUM-DEXTROSE 2-4 GM/100ML-% IV SOLN
2.0000 g | INTRAVENOUS | Status: AC
  Administered 2020-06-30: 2 g via INTRAVENOUS
  Filled 2020-06-30: qty 100

## 2020-06-30 MED ORDER — FENTANYL CITRATE (PF) 100 MCG/2ML IJ SOLN: 25.0000 ug | INTRAMUSCULAR | Status: DC | PRN

## 2020-06-30 MED ORDER — LIDOCAINE 2% (20 MG/ML) 5 ML SYRINGE
INTRAMUSCULAR | Status: DC | PRN
  Administered 2020-06-30: 80 mg via INTRAVENOUS

## 2020-06-30 MED ORDER — POLYETHYLENE GLYCOL 3350 17 G PO PACK: 17.0000 g | PACK | Freq: Every day | ORAL | 0 refills | Status: AC

## 2020-06-30 MED ORDER — LIDOCAINE 2% (20 MG/ML) 5 ML SYRINGE
INTRAMUSCULAR | Status: AC
  Filled 2020-06-30: qty 5

## 2020-06-30 MED ORDER — PROPOFOL 10 MG/ML IV BOLUS
INTRAVENOUS | Status: DC | PRN
  Administered 2020-06-30: 100 mg via INTRAVENOUS

## 2020-06-30 MED ORDER — DOCUSATE SODIUM 100 MG PO CAPS: 100.0000 mg | ORAL_CAPSULE | Freq: Two times a day (BID) | ORAL | 1 refills | Status: AC | PRN

## 2020-06-30 MED ORDER — SUGAMMADEX SODIUM 500 MG/5ML IV SOLN
INTRAVENOUS | Status: DC | PRN
  Administered 2020-06-30: 200 mg via INTRAVENOUS

## 2020-06-30 MED ORDER — FENTANYL CITRATE (PF) 100 MCG/2ML IJ SOLN
INTRAMUSCULAR | Status: AC
  Filled 2020-06-30: qty 2

## 2020-06-30 MED ORDER — ONDANSETRON HCL 4 MG/2ML IJ SOLN: 4.0000 mg | Freq: Once | INTRAMUSCULAR | Status: DC | PRN

## 2020-06-30 MED ORDER — PHENYLEPHRINE 40 MCG/ML (10ML) SYRINGE FOR IV PUSH (FOR BLOOD PRESSURE SUPPORT)
PREFILLED_SYRINGE | INTRAVENOUS | Status: DC | PRN
  Administered 2020-06-30 (×2): 40 ug via INTRAVENOUS
  Administered 2020-06-30: 120 ug via INTRAVENOUS
  Administered 2020-06-30: 40 ug via INTRAVENOUS
  Administered 2020-06-30: 80 ug via INTRAVENOUS
  Administered 2020-06-30 (×2): 40 ug via INTRAVENOUS

## 2020-06-30 MED ORDER — PHENYLEPHRINE 40 MCG/ML (10ML) SYRINGE FOR IV PUSH (FOR BLOOD PRESSURE SUPPORT)
PREFILLED_SYRINGE | INTRAVENOUS | Status: AC
  Filled 2020-06-30: qty 10

## 2020-06-30 MED ORDER — LACTATED RINGERS IV SOLN: INTRAVENOUS | Status: DC

## 2020-06-30 MED ORDER — FENTANYL CITRATE (PF) 100 MCG/2ML IJ SOLN
INTRAMUSCULAR | Status: DC | PRN
Start: 2020-06-30 — End: 2020-06-30
  Administered 2020-06-30 (×2): 50 ug via INTRAVENOUS
  Administered 2020-06-30: 25 ug via INTRAVENOUS
  Administered 2020-06-30: 50 ug via INTRAVENOUS
  Administered 2020-06-30: 25 ug via INTRAVENOUS

## 2020-06-30 MED ORDER — CHLORHEXIDINE GLUCONATE 0.12 % MT SOLN
15.0000 mL | Freq: Once | OROMUCOSAL | Status: AC
  Administered 2020-06-30: 15 mL via OROMUCOSAL

## 2020-06-30 MED ORDER — OXYCODONE-ACETAMINOPHEN 5-325 MG PO TABS
1.0000 | ORAL_TABLET | Freq: Once | ORAL | Status: AC
  Administered 2020-06-30: 1 via ORAL

## 2020-06-30 MED ORDER — PHENYLEPHRINE HCL-NACL 10-0.9 MG/250ML-% IV SOLN
INTRAVENOUS | Status: DC | PRN
  Administered 2020-06-30: 25 ug/min via INTRAVENOUS

## 2020-06-30 MED ORDER — BUPIVACAINE-EPINEPHRINE 0.5% -1:200000 IJ SOLN
INTRAMUSCULAR | Status: DC | PRN
  Administered 2020-06-30: 6 mL

## 2020-06-30 MED ORDER — DEXAMETHASONE SODIUM PHOSPHATE 10 MG/ML IJ SOLN
INTRAMUSCULAR | Status: AC
  Filled 2020-06-30: qty 1

## 2020-06-30 MED ORDER — THROMBIN (RECOMBINANT) 5000 UNITS EX SOLR
CUTANEOUS | Status: AC
  Filled 2020-06-30: qty 10000

## 2020-06-30 MED ORDER — OXYCODONE-ACETAMINOPHEN 5-325 MG PO TABS
ORAL_TABLET | ORAL | Status: AC
  Filled 2020-06-30: qty 1

## 2020-06-30 MED ORDER — OXYCODONE-ACETAMINOPHEN 5-325 MG PO TABS: 1.0000 | ORAL_TABLET | ORAL | 0 refills | Status: AC | PRN

## 2020-06-30 MED ORDER — THROMBIN 5000 UNITS EX SOLR: OROMUCOSAL | Status: DC | PRN

## 2020-06-30 MED ORDER — SODIUM CHLORIDE 0.9 % IV SOLN
INTRAVENOUS | Status: DC | PRN
  Administered 2020-06-30: 500 mL

## 2020-06-30 MED ORDER — BUPIVACAINE-EPINEPHRINE (PF) 0.5% -1:200000 IJ SOLN
INTRAMUSCULAR | Status: AC
  Filled 2020-06-30: qty 30

## 2020-06-30 MED ORDER — ACETAMINOPHEN 10 MG/ML IV SOLN
1000.0000 mg | INTRAVENOUS | Status: AC
  Administered 2020-06-30: 1000 mg via INTRAVENOUS
  Filled 2020-06-30: qty 100

## 2020-06-30 MED ORDER — EPHEDRINE 5 MG/ML INJ
INTRAVENOUS | Status: AC
  Filled 2020-06-30: qty 10

## 2020-06-30 MED ORDER — SODIUM CHLORIDE 0.9 % IV SOLN
INTRAVENOUS | Status: AC
  Filled 2020-06-30: qty 500000

## 2020-06-30 MED ORDER — DEXAMETHASONE SODIUM PHOSPHATE 10 MG/ML IJ SOLN
INTRAMUSCULAR | Status: DC | PRN
  Administered 2020-06-30: 6 mg via INTRAVENOUS

## 2020-06-30 MED ORDER — ONDANSETRON HCL 4 MG/2ML IJ SOLN
INTRAMUSCULAR | Status: DC | PRN
  Administered 2020-06-30: 4 mg via INTRAVENOUS

## 2020-06-30 MED ORDER — ONDANSETRON HCL 4 MG/2ML IJ SOLN
INTRAMUSCULAR | Status: AC
  Filled 2020-06-30: qty 2

## 2020-06-30 MED ORDER — EPHEDRINE SULFATE-NACL 50-0.9 MG/10ML-% IV SOSY
PREFILLED_SYRINGE | INTRAVENOUS | Status: DC | PRN
  Administered 2020-06-30 (×2): 5 mg via INTRAVENOUS

## 2020-06-30 MED ORDER — ACETAMINOPHEN 500 MG PO TABS: 1000.0000 mg | ORAL_TABLET | Freq: Once | ORAL | Status: DC

## 2020-06-30 MED ORDER — PROPOFOL 10 MG/ML IV BOLUS
INTRAVENOUS | Status: AC
  Filled 2020-06-30: qty 40

## 2020-06-30 MED ORDER — ORAL CARE MOUTH RINSE: 15.0000 mL | Freq: Once | OROMUCOSAL | Status: AC

## 2020-06-30 SURGICAL SUPPLY — 62 items
AGENT HMST SPONGE THK3/8 (HEMOSTASIS)
BAG DECANTER FOR FLEXI CONT (MISCELLANEOUS) ×3 IMPLANT
BAG SPEC THK2 15X12 ZIP CLS (MISCELLANEOUS)
BAG ZIPLOCK 12X15 (MISCELLANEOUS) IMPLANT
BAND INSRT 18 STRL LF DISP RB (MISCELLANEOUS) ×2
BAND RUBBER #18 3X1/16 STRL (MISCELLANEOUS) ×6 IMPLANT
CATH FOLEY 2WAY 5CC 16FR (CATHETERS) ×3
CATH URTH STD 16FR FL 2W DRN (CATHETERS) IMPLANT
CLEANER TIP ELECTROSURG 2X2 (MISCELLANEOUS) ×3 IMPLANT
CLOSURE WOUND 1/2 X4 (GAUZE/BANDAGES/DRESSINGS) ×1
COVER SURGICAL LIGHT HANDLE (MISCELLANEOUS) ×3 IMPLANT
COVER WAND RF STERILE (DRAPES) IMPLANT
DRAPE MICROSCOPE LEICA (MISCELLANEOUS) ×5 IMPLANT
DRAPE POUCH INSTRU U-SHP 10X18 (DRAPES) ×3 IMPLANT
DRAPE SHEET LG 3/4 BI-LAMINATE (DRAPES) ×3 IMPLANT
DRAPE SURG 17X11 SM STRL (DRAPES) ×3 IMPLANT
DRAPE UTILITY XL STRL (DRAPES) ×3 IMPLANT
DRESSING AQUACEL AG SP 3.5X6 (GAUZE/BANDAGES/DRESSINGS) IMPLANT
DRSG AQUACEL AG ADV 3.5X 4 (GAUZE/BANDAGES/DRESSINGS) ×2 IMPLANT
DRSG AQUACEL AG ADV 3.5X 6 (GAUZE/BANDAGES/DRESSINGS) IMPLANT
DRSG AQUACEL AG SP 3.5X6 (GAUZE/BANDAGES/DRESSINGS) ×3
DRSG TELFA 3X8 NADH (GAUZE/BANDAGES/DRESSINGS) IMPLANT
DURAPREP 26ML APPLICATOR (WOUND CARE) ×5 IMPLANT
DURASEAL SPINE SEALANT 3ML (MISCELLANEOUS) IMPLANT
ELECT BLADE TIP CTD 4 INCH (ELECTRODE) IMPLANT
ELECT REM PT RETURN 15FT ADLT (MISCELLANEOUS) ×3 IMPLANT
GLOVE BIOGEL PI IND STRL 7.5 (GLOVE) ×1 IMPLANT
GLOVE BIOGEL PI INDICATOR 7.5 (GLOVE) ×2
GLOVE SURG SS PI 7.5 STRL IVOR (GLOVE) ×6 IMPLANT
GLOVE SURG SS PI 8.0 STRL IVOR (GLOVE) ×6 IMPLANT
GOWN STRL REUS W/TWL XL LVL3 (GOWN DISPOSABLE) ×6 IMPLANT
HEMOSTAT SPONGE AVITENE ULTRA (HEMOSTASIS) IMPLANT
IV CATH 14GX2 1/4 (CATHETERS) ×3 IMPLANT
KIT BASIN OR (CUSTOM PROCEDURE TRAY) ×3 IMPLANT
KIT POSITIONING SURG ANDREWS (MISCELLANEOUS) ×3 IMPLANT
KIT TURNOVER KIT A (KITS) IMPLANT
MANIFOLD NEPTUNE II (INSTRUMENTS) ×3 IMPLANT
NDL SPNL 18GX3.5 QUINCKE PK (NEEDLE) ×2 IMPLANT
NEEDLE SPNL 18GX3.5 QUINCKE PK (NEEDLE) ×6 IMPLANT
PACK LAMINECTOMY ORTHO (CUSTOM PROCEDURE TRAY) ×3 IMPLANT
PAD DRESSING TELFA 3X8 NADH (GAUZE/BANDAGES/DRESSINGS) IMPLANT
PATTIES SURGICAL .5 X.5 (GAUZE/BANDAGES/DRESSINGS) IMPLANT
PATTIES SURGICAL .75X.75 (GAUZE/BANDAGES/DRESSINGS) ×2 IMPLANT
PATTIES SURGICAL 1X1 (DISPOSABLE) IMPLANT
PENCIL SMOKE EVACUATOR (MISCELLANEOUS) IMPLANT
SPONGE LAP 4X18 RFD (DISPOSABLE) ×6 IMPLANT
SPONGE SURGIFOAM ABS GEL 100 (HEMOSTASIS) ×3 IMPLANT
STAPLER VISISTAT (STAPLE) ×2 IMPLANT
STRIP CLOSURE SKIN 1/2X4 (GAUZE/BANDAGES/DRESSINGS) ×2 IMPLANT
SUT NURALON 4 0 TR CR/8 (SUTURE) IMPLANT
SUT PROLENE 3 0 PS 2 (SUTURE) ×3 IMPLANT
SUT VIC AB 1 CT1 27 (SUTURE) ×3
SUT VIC AB 1 CT1 27XBRD ANTBC (SUTURE) IMPLANT
SUT VIC AB 1-0 CT2 27 (SUTURE) ×3 IMPLANT
SUT VIC AB 2-0 CT1 27 (SUTURE)
SUT VIC AB 2-0 CT1 TAPERPNT 27 (SUTURE) IMPLANT
SUT VIC AB 2-0 CT2 27 (SUTURE) ×3 IMPLANT
SYR 3ML LL SCALE MARK (SYRINGE) IMPLANT
TOWEL OR 17X26 10 PK STRL BLUE (TOWEL DISPOSABLE) ×3 IMPLANT
TOWEL OR NON WOVEN STRL DISP B (DISPOSABLE) IMPLANT
WIPE CHG CHLORHEXIDINE 2% (PERSONAL CARE ITEMS) ×3 IMPLANT
YANKAUER SUCT BULB TIP NO VENT (SUCTIONS) ×2 IMPLANT

## 2020-06-30 NOTE — Procedures (Signed)
Procedure: Simple foley catheter insertion  CPT: 20233  Indication: 79yo male who underwent microlumbar L4-5 decompression with orthopedic surgery this morning and required foley catheter placement for post-operative management.  RN tried to place a 16 Fr and 14 Fr straight-tipped foley catheter without success and subsequent blood per urethra. Urology was consulted for further assistance.   Details:  The patient was seen in the operating room. He was still intubated and sedated. Physical exam demonstrated normal, circumcised phallus with normal urethral meatus. His penis was prepped with betadine. I easily advanced a 16 Fr coude-tipped foley catheter with copious lubrication up to the hub with return of thin, cherry-red urine without clots. The foley balloon was inflated with 10cc sterile water, and the foley catheter was connected to a drainage bag.   Recommendations: - If patient is discharging later today and amount of urine drained on initial placement of foley catheter is <1L, then ok to remove foley catheter in PACU and perform a TOV. If staying overnight, then ok to remove foley in morning. If amount of urine drained >1L, then please leave foley catheter in place for 7-10 days for bladder rest - Patient should be encouraged to drink lots of water to help dilute his hematuria, which can last several days. If he is passing quarter-sized clots or his foley catheter stops draining, please page urology  - Please place an outpatient urology referral if patient endorses bothersome voiding symptoms at baseline and would like to see a urologist OR if he is sent home with a foley catheter (please page urology if the latter occurs)

## 2020-06-30 NOTE — Anesthesia Postprocedure Evaluation (Signed)
Anesthesia Post Note  Patient: [name]  Procedure(s) Performed: Microlumbar decompression L4-5, excision of synoval cyst (N/A )     Patient location during evaluation: PACU Anesthesia Type: General Level of consciousness: awake and alert Pain management: pain level controlled Vital Signs Assessment: post-procedure vital signs reviewed and stable Respiratory status: spontaneous breathing, nonlabored ventilation, respiratory function stable and patient connected to nasal cannula oxygen Cardiovascular status: blood pressure returned to baseline and stable Postop Assessment: no apparent nausea or vomiting Anesthetic complications: no   No complications documented.  Last Vitals:  Vitals:   06/30/20 1115 06/30/20 1130  BP: 126/79 133/74  Pulse: 97 88  Resp: 14 13  Temp: 36.8 C   SpO2: 99% 97%    Last Pain:  Vitals:   06/30/20 1115  TempSrc:   PainSc: 0-No pain                 Catalina Gravel

## 2020-06-30 NOTE — Transfer of Care (Signed)
Immediate Anesthesia Transfer of Care Note  Patient: [name]  Procedure(s) Performed: Procedure(s) with comments: Microlumbar decompression L4-5, excision of synoval cyst (N/A) - 2.5 hrs  Patient Location: PACU  Anesthesia Type:General  Level of Consciousness:  sedated, patient cooperative and responds to stimulation  Airway & Oxygen Therapy:Patient Spontanous Breathing and Patient connected to face mask oxgen  Post-op Assessment:  Report given to PACU RN and Post -op Vital signs reviewed and stable  Post vital signs:  Reviewed and stable  Last Vitals:  Vitals:   06/30/20 0716  BP: (!) 136/93  Pulse: 90  Resp: 18  Temp: 36.9 C  SpO2: 30%    Complications: No apparent anesthesia complications

## 2020-06-30 NOTE — Interval H&P Note (Signed)
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  The various methods of treatment have been discussed with the patient and family. After consideration of risks, benefits and other options for treatment, the patient has consented to  Procedure(s) with comments: Microlumbar decompression L4-5, excision of synoval cyst (N/A) - 2.5 hrs as a surgical intervention.  The patient's history has been reviewed, patient examined, no change in status, stable for surgery.  I have reviewed the patient's chart and labs.  Questions were answered to the patient's satisfaction.     [name]

## 2020-06-30 NOTE — Discharge Instructions (Signed)

## 2020-06-30 NOTE — Brief Op Note (Signed)
06/30/2020  10:44 AM  PATIENT:  [name]  79 y.o. 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  PROCEDURE:  Procedure(s) with comments: Microlumbar decompression L4-5, excision of synoval cyst (N/A) - 2.5 hrs  SURGEON:  Surgeon(s) and Role:    Susa Day, MD - Primary  PHYSICIAN ASSISTANT:   ASSISTANTS: [place]   ANESTHESIA:   general  EBL:  125 mL   BLOOD ADMINISTERED:none  DRAINS: none   LOCAL MEDICATIONS USED:  MARCAINE     SPECIMEN:  Source of Specimen:  L45 cyst  DISPOSITION OF SPECIMEN:  PATHOLOGY  COUNTS:  YES  TOURNIQUET:  * No tourniquets in log *  DICTATION: .Other Dictation: Dictation Number [id number]  PLAN OF CARE: Discharge to home after PACU  PATIENT DISPOSITION:  PACU - hemodynamically stable.   Delay start of Pharmacological VTE agent (>24hrs) due to surgical blood loss or risk of bleeding: yes

## 2020-06-30 NOTE — H&P (Signed)
[name] is an 79 y.o. male.   Chief Complaint: Patient has bilateral leg pain with ambulation.  Mainly on the left also into the right weakness associated with that.  Is reported no change in bowel or bladder function associated with that. HPI: Patient reports pain in his legs when he ambulates.  It is relieved with sitting.  Worse with standing.  He has had no problems with his bowel or bladder function.  His MRI is indicated a spinal stenosis at L4-5 due to degenerative changes as well as a synovial cyst emanating from the 4 5 facet.  He has been refractory to conservative treatment.  Due to the severity of his pain and his dysfunction is indicated for a microlumbar decompression at L4-5.  Past Medical History:  Diagnosis Date  . Hyperlipidemia   . Hypertension   . Pancreatitis 06/2010   gallstone/necrotizing pancreatitis 06/2010  . Thymoma, malignant     Past Surgical History:  Procedure Laterality Date  . CHOLECYSTECTOMY  01/16/2011  . STERNOTOMY  10/17/2010   for thymoma resection    Family History  Problem Relation Age of Onset  . Lung cancer Sister        heavy smoker  . Colon cancer Neg Hx    Social History:  reports that he quit smoking about 31 years ago. His smoking use included cigarettes. He has never used smokeless tobacco. He reports that he does not drink alcohol and does not use drugs.  Allergies: No Known Allergies  Medications Prior to Admission  Medication Sig Dispense Refill  . diphenhydrAMINE (BENADRYL) 25 MG tablet Take 25 mg by mouth at bedtime.    Marland Kitchen lisinopril-hydrochlorothiazide (PRINZIDE,ZESTORETIC) 20-12.5 MG per tablet Take 1 tablet by mouth daily.      Marland Kitchen lovastatin (MEVACOR) 40 MG tablet Take 40 mg by mouth daily with supper.    . finasteride (PROSCAR) 5 MG tablet Take 5 mg by mouth daily.   (Patient not taking: Reported on 06/28/2020)    . omeprazole (PRILOSEC) 40 MG capsule TAKE ONE CAPSULE BY MOUTH EVERY DAY (Patient not taking: Reported on  06/28/2020) 90 capsule 0    Results for orders placed or performed during the hospital encounter of 06/29/20 (from the past 48 hour(s))  SARS CORONAVIRUS 2 (TAT 6-24 HRS) Nasopharyngeal Nasopharyngeal Swab     Status: None   Collection Time: 06/29/20 11:17 AM   Specimen: Nasopharyngeal Swab  Result Value Ref Range   SARS Coronavirus 2 NEGATIVE NEGATIVE    Comment: (NOTE) SARS-CoV-2 target nucleic acids are NOT DETECTED.  The SARS-CoV-2 RNA is generally detectable in upper and lower respiratory specimens during the acute phase of infection. Negative results do not preclude SARS-CoV-2 infection, do not rule out co-infections with other pathogens, and should not be used as the sole basis for treatment or other patient management decisions. Negative results must be combined with clinical observations, patient history, and epidemiological information. The expected result is Negative.  Fact Sheet for Patients: SugarRoll.be  Fact Sheet for Healthcare Providers: https://www.woods-mathews.com/  This test is not yet approved or cleared by the [place] FDA and  has been authorized for detection and/or diagnosis of SARS-CoV-2 by FDA under an Emergency Use Authorization (EUA). This EUA will remain  in effect (meaning this test can be used) for the duration of the COVID-19 declaration under Se ction 564(b)(1) of the Act, 21 U.S.C. section 360bbb-3(b)(1), unless the authorization is terminated or revoked sooner.  Performed at [facility], [place]  [address] [place], [place] [address]    DG Lumbar Spine 2-3 Views  Result Date: 06/29/2020 CLINICAL DATA:  Preop EXAM: LUMBAR SPINE - 2-3 VIEW COMPARISON:  None. FINDINGS: Lumbar alignment within normal limits. Aortic atherosclerosis. Minimal chronic appearing wedging of T11 and T12. Mild degenerative osteophytes throughout the lumbar spine with relatively preserved disc spaces. Facet degenerative  change of the lower lumbar spine. IMPRESSION: Mild degenerative changes of the lumbar spine. No acute osseous abnormality. Electronically Signed   By: Donavan Foil M.D.   On: 06/29/2020 22:15    Review of Systems  HENT: Negative.   Respiratory: Negative.   Cardiovascular: Negative.   Gastrointestinal: Negative.   Genitourinary: Negative.   Musculoskeletal: Positive for arthralgias.  Neurological: Positive for numbness.  Psychiatric/Behavioral: The patient is nervous/anxious.   All other systems reviewed and are negative.   Blood pressure (!) 136/93, pulse 90, temperature 98.5 F (36.9 C), temperature source Oral, resp. rate 18, height 5\' 7"  (1.702 m), weight 73.7 kg, SpO2 95 %. Physical Exam Vitals reviewed.  HENT:     Head: Normocephalic.  Eyes:     Pupils: Pupils are equal, round, and reactive to light.  Cardiovascular:     Rate and Rhythm: Normal rate.     Pulses: Normal pulses.  Pulmonary:     Effort: Pulmonary effort is normal.     Breath sounds: Normal breath sounds.  Abdominal:     General: Abdomen is flat.     Palpations: Abdomen is soft.  Musculoskeletal:        General: Normal range of motion.     Cervical back: Normal range of motion.  Skin:    General: Skin is warm and dry.  Neurological:     Mental Status: He is alert and oriented to person, place, and time.     Comments: Patient is in mild distress.  Straight leg raise is positive bilaterally.  He has EHL weakness bilaterally.  Pulses are intact.  No DVT.  1+ DTRs.  Nontender over the thoracic spine.  Psychiatric:        Mood and Affect: Mood normal.   MRI of the lumbar spine demonstrates multifactorial spinal stenosis at L4-5 due to a bulging disc. Severe arthropathy of the facet joints.  Synovial cyst emanating from the right facet joint.  With lateral recess stenosis.  Degenerative changes at L3-4 and L4-5.  With mild spinal stenosis noted. His x-rays demonstrated no instability in flexion  extension.  Assessment/Plan Impression:  Patient demonstrates refractory neurogenic claudication secondary to spinal stenosis severe multifactorial L4-5 including a synovial cyst from the right L4-5 facet.  Plan:  We discussed microlumbar decompression at L4-5 with a synovial cyst excision.  Risk and benefits discussed including bleeding, infection, damage to neurovascular structures, CSF leakage, recurrent cyst formation, need for fusion in the future, DVT, PE etc.    [name], MD 06/30/2020, 8:10 AM

## 2020-06-30 NOTE — Op Note (Signed)
NAMEHONORIO, [name] MEDICAL RECORD SN:[id number] ACCOUNT [id number] DATE OF BIRTH:Dec 21, 1940 FACILITY: [facility] LOCATION: [facility] PHYSICIAN:Cattleya Dobratz Windy Kalata, MD  OPERATIVE REPORT  DATE OF PROCEDURE:  06/30/2020  PREOPERATIVE DIAGNOSIS:  Spinal stenosis at L4-L5 and synovial cyst L4-L5 facet.  POSTOPERATIVE DIAGNOSIS:  Spinal stenosis at L4-L5 and synovial cyst L4-L5 facet.  PROCEDURE PERFORMED: 1.  Microlumbar decompression at L4-L5. 2.  Foraminotomies at L4 and L5. 3.  Excision of synovial cyst, L4-L5, right.  ANESTHESIA:  General.  SURGEON:  Susa Day, MD  ASSISTANT:  Lacie Draft, PA  HISTORY:  A 79 year old with bilateral lower extremity radicular pain secondary to severe spinal stenosis at L4-L5 with associated synovial cyst compressing the L5 nerve roots.  He had neural tension signs and EHL weakness.  He was indicated for  decompression given the MRI showing a synovial cyst emanating from the L4-L5 facet, associated ligamentum flavum and facet hypertrophy.  Risks and benefits discussed, including bleeding, infection, damage to neurovascular structures, no change in  symptoms, worsening symptoms, DVT, PE, anesthetic complications, etc.  DESCRIPTION OF PROCEDURE:  With the patient in supine position, after induction of adequate general anesthesia, 2 g Kefzol, he was placed prone on the Wilson frame.  All bony prominences well padded.  Lumbar region was prepped and draped in the usual  sterile fashion.  Two 18-gauge spinal needles were utilized to localize the L4-5 interspace, confirmed by x-ray.  Incision was made from above the spinous process of L4 to below the spinous process of L5.  Subcutaneous tissue was dissected.   Electrocautery was utilized to achieve hemostasis.  Dorsal lumbar fascia divided in line with the skin incision.  Paraspinous muscle elevated from lamina of L4-5.  Electrocautery was utilized to achieve strict hemostasis.  Confirmatory radiograph  obtained  with a Kocher on the spinous process of L4.  Next, a Leksell was utilized to remove the spinous process of L4 and L5 and to perform a hemilaminotomy of the caudad edge of L4 bilaterally.  Bone wax was placed on the cancellous surfaces.   Operating microscope was draped and brought in the surgical field.  Ligamentum flavum was detached from the cephalad edge of L5 bilaterally utilizing a straight micro curette.  We identified the inferior portion of the L4-L5 facets bilaterally.  Next,  ligamentum flavum was detached from the caudad edge of L4 utilizing 2 mm Kerrisons and performing hemilaminotomies bilaterally to detach the ligamentum flavum.  On the left, ligamentum flavum was detached from the cephalad edge of the lamina.  A Woodson  retractor was then placed beneath that to protect the L5 nerve root.  The L5 nerve root was protected and a foraminotomy of L5 was performed and I decompressed the lateral recess to the medial border of the pedicle, removing hypertrophic ligamentum and  hypertrophic facet.  Again, a hemilaminotomy of the cephalad edge of L5 was performed, protecting the neural elements at all times.  Foraminotomy of L4 on the left was performed as well.  Bone wax placed on all cancellous surfaces.   We decompressed the  lateral recess, which the L5 root was compressed in.  Then from left, we began removing ligamentum flavum from the right with a Woodson retractor developing a plane beneath the thecal sac the ligamentum flavum and synovial cyst.  After detaching ligamentum flavum from the cephalad edge of lamina of L5 on  the right, we performed a foraminotomy of L5.  We then identified the L5 root, developed a plane between the neural elements  in the facet as well.  We decompressed the lateral recess and medial border of the pedicle.  This was hypertrophic ligamentum  flavum and facet compressing the neural elements.  We were able to mobilize the neural elements medially.  I detached the  cystic structure from the superior articulating process of L5 and from the facet.  A portion of the facet and ligamentum flavum and  synovial cyst was removed.  Portions of the synovial cyst sent to pathology for evaluation.  Next, we continued the decompression cephalad.  We performed a foraminotomy of L4.  Ligamentum flavum was hypertrophied and into the foramen of L4.  Following  this, a neural probe passed freely out the foramen of L4 and of L5.  We decompressed up to the pedicle of L4.  All ligamentum flavum was removed from the interspace.  There was good restoration of the thecal sac.  Confirmatory radiograph obtained with a  Woodson in the foramen of L4 and  L5.  There were some adhesions of the cyst to the thecal sac, but we were able to meticulously develop a plane between the thecal sac and the cyst with a Woodson and a Penfield 4.  Bone wax was placed in all cancellous  surfaces.  Bipolar cautery was utilized to achieve hemostasis.  We used thrombin-soaked Gelfoam in the lateral recesses and the paraspinous musculature to aid in hemostasis.  These were subsequently removed after copious irrigation was performed with  antibiotic irrigation.  Again, there was 1 cm of excursion of the L5 root medially of the pedicle without tension.  Neural probe was passed freely out the foramen of L4 and L5 bilaterally and cephalad above the neural arch of L4and below the neural arch  of L5.  McCullough retractors were removed following this.   Paraspinous muscles inspected and no evidence of active bleeding.  They were copiously irrigated.  I closed the dorsal lumbar fascia with #1 Vicryl in interrupted figure-of-eight sutures with a  small aperture of the distal part of the wound.  It was approximately 0.5 cm.  Subcutaneous with 2-0 and skin with staples.  Prior to this, full inspection revealed no evidence of active bleeding.  It was a dry surgical site.  We felt no drain was  necessary.  I then closed the  subcutaneous with 2-0 and skin with staples.  The wound was dressed sterilely, placed supine on the hospital bed, extubated without difficulty.  In and out catheterization was performed.  The patient was transported to the  recovery room in satisfactory condition.  The patient tolerated the procedure well.  No complications.  Lacie Draft PA was used throughout the case for traction and assistance under the operating microscope.  Blood loss 125 mL.  Postoperative evaluation patient awake. No leg pain. Motor 5/5 lower extremeties. Sensation intact. Foley to gravity. Hematuria due to foley insertion. Discussed with Urology Resident Orvil Feil. If Urine output less than 1000 then can DC foley. One to two days of hematuria. Can go home. Discussed with nursing.   VN/NUANCE  D:06/30/2020 T:06/30/2020 JOB:[id number]

## 2020-06-30 NOTE — Anesthesia Procedure Notes (Signed)
Procedure Name: Intubation Date/Time: 06/30/2020 8:42 AM Performed by: Lavina Hamman, CRNA Pre-anesthesia Checklist: Patient identified, Emergency Drugs available, Suction available, Patient being monitored and Timeout performed Patient Re-evaluated:Patient Re-evaluated prior to induction Oxygen Delivery Method: Circle system utilized Preoxygenation: Pre-oxygenation with 100% oxygen Induction Type: IV induction Ventilation: Mask ventilation without difficulty Laryngoscope Size: Mac and 3 Grade View: Grade I Tube type: Oral Tube size: 7.5 mm Number of attempts: 1 Airway Equipment and Method: Stylet Placement Confirmation: ETT inserted through vocal cords under direct vision,  positive ETCO2,  CO2 detector and breath sounds checked- equal and bilateral Secured at: 22 cm Tube secured with: Tape Dental Injury: Teeth and Oropharynx as per pre-operative assessment  Comments: ATOI

## 2020-07-01 ENCOUNTER — Encounter (HOSPITAL_COMMUNITY): Payer: Self-pay | Admitting: Specialist

## 2020-07-01 LAB — SURGICAL PATHOLOGY

## 2020-07-01 NOTE — Progress Notes (Signed)
I discussed outpatient if patient doing well postop. Has a wife at home and a sister. Instructed to ambulate frequently. Pain medicine as needed. To call if problems with pain, Voiding or bleeding. He feels he would be fine.  Rx's to be sent postop to his pharmacy.

## 2020-07-01 NOTE — Progress Notes (Signed)
Postop Note:  Patient doing well. Motor 5/5 lower extremeties. Sensation intact. VSS. Foley to gravity with hematuria 200cc.  Plan to D/C foley if less than 1000cc later today and do a postvoid residual And DC to home if less than 100cc. If not than keep catheter in for a few days per Urology. Hematuria for two to three days if dc'd.  Patient to be d/c'd if ambulating, taking po, pain under control And voiding.  I discussed this with nursing.  And will be in contact with after the completion of the postoperative post void residual for further disposition.  WIll contact patient later to discuss again.

## 2020-08-26 DIAGNOSIS — E782 Mixed hyperlipidemia: Secondary | ICD-10-CM | POA: Diagnosis not present

## 2020-08-26 DIAGNOSIS — I10 Essential (primary) hypertension: Secondary | ICD-10-CM | POA: Diagnosis not present

## 2020-08-26 DIAGNOSIS — R7303 Prediabetes: Secondary | ICD-10-CM | POA: Diagnosis not present

## 2020-10-14 DIAGNOSIS — H524 Presbyopia: Secondary | ICD-10-CM | POA: Diagnosis not present

## 2020-10-14 DIAGNOSIS — H25812 Combined forms of age-related cataract, left eye: Secondary | ICD-10-CM | POA: Diagnosis not present

## 2021-03-31 DIAGNOSIS — R946 Abnormal results of thyroid function studies: Secondary | ICD-10-CM | POA: Diagnosis not present

## 2021-04-01 DIAGNOSIS — E785 Hyperlipidemia, unspecified: Secondary | ICD-10-CM | POA: Diagnosis not present

## 2021-04-01 DIAGNOSIS — I10 Essential (primary) hypertension: Secondary | ICD-10-CM | POA: Diagnosis not present

## 2021-04-01 DIAGNOSIS — D692 Other nonthrombocytopenic purpura: Secondary | ICD-10-CM | POA: Diagnosis not present

## 2021-04-01 DIAGNOSIS — N401 Enlarged prostate with lower urinary tract symptoms: Secondary | ICD-10-CM | POA: Diagnosis not present

## 2021-04-01 DIAGNOSIS — Z0000 Encounter for general adult medical examination without abnormal findings: Secondary | ICD-10-CM | POA: Diagnosis not present

## 2021-04-01 DIAGNOSIS — Z1159 Encounter for screening for other viral diseases: Secondary | ICD-10-CM | POA: Diagnosis not present

## 2021-04-01 DIAGNOSIS — R7301 Impaired fasting glucose: Secondary | ICD-10-CM | POA: Diagnosis not present

## 2021-04-01 DIAGNOSIS — Z79899 Other long term (current) drug therapy: Secondary | ICD-10-CM | POA: Diagnosis not present

## 2021-04-01 DIAGNOSIS — J309 Allergic rhinitis, unspecified: Secondary | ICD-10-CM | POA: Diagnosis not present

## 2021-04-01 DIAGNOSIS — R946 Abnormal results of thyroid function studies: Secondary | ICD-10-CM | POA: Diagnosis not present

## 2021-04-01 DIAGNOSIS — I2510 Atherosclerotic heart disease of native coronary artery without angina pectoris: Secondary | ICD-10-CM | POA: Diagnosis not present

## 2021-04-27 DIAGNOSIS — H353210 Exudative age-related macular degeneration, right eye, stage unspecified: Secondary | ICD-10-CM | POA: Diagnosis not present

## 2021-04-27 DIAGNOSIS — H25812 Combined forms of age-related cataract, left eye: Secondary | ICD-10-CM | POA: Diagnosis not present

## 2021-05-09 DIAGNOSIS — H40053 Ocular hypertension, bilateral: Secondary | ICD-10-CM | POA: Diagnosis not present

## 2021-05-09 DIAGNOSIS — H25812 Combined forms of age-related cataract, left eye: Secondary | ICD-10-CM | POA: Diagnosis not present

## 2021-05-09 DIAGNOSIS — Z01818 Encounter for other preprocedural examination: Secondary | ICD-10-CM | POA: Diagnosis not present

## 2021-05-16 DIAGNOSIS — H40052 Ocular hypertension, left eye: Secondary | ICD-10-CM | POA: Diagnosis not present

## 2021-05-16 DIAGNOSIS — H25812 Combined forms of age-related cataract, left eye: Secondary | ICD-10-CM | POA: Diagnosis not present

## 2021-05-16 DIAGNOSIS — H409 Unspecified glaucoma: Secondary | ICD-10-CM | POA: Diagnosis not present

## 2021-06-07 DIAGNOSIS — R946 Abnormal results of thyroid function studies: Secondary | ICD-10-CM | POA: Diagnosis not present

## 2021-08-05 IMAGING — CR DG LUMBAR SPINE 2-3V
2 series · 2 of 2 positions shown · non-contrast
Comparison: None.

CLINICAL DATA: Preop

EXAM:
LUMBAR SPINE - 2-3 VIEW

[w lumbar spine ap (1 of 2)]
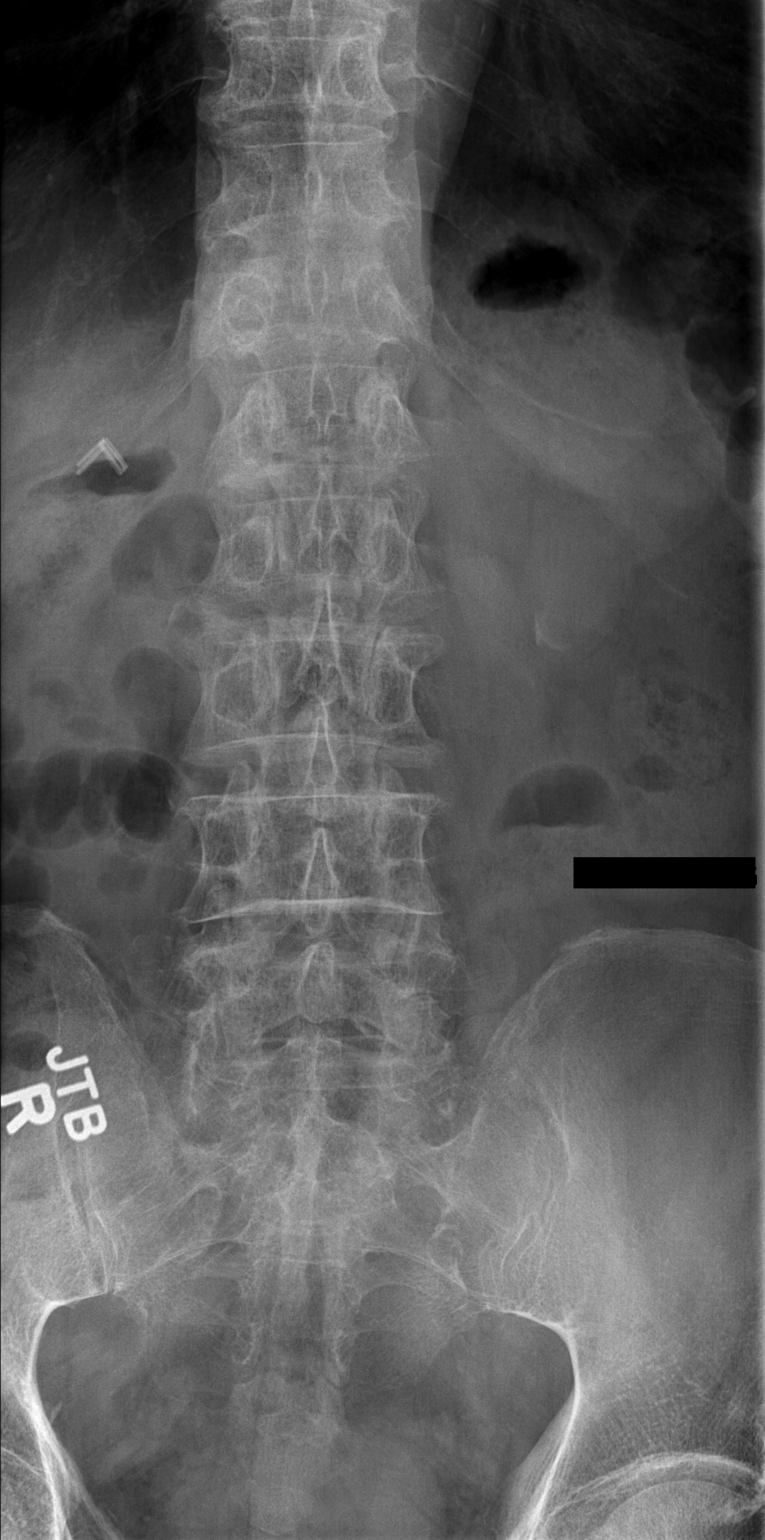

[w lumbar spine ap (2 of 2)]
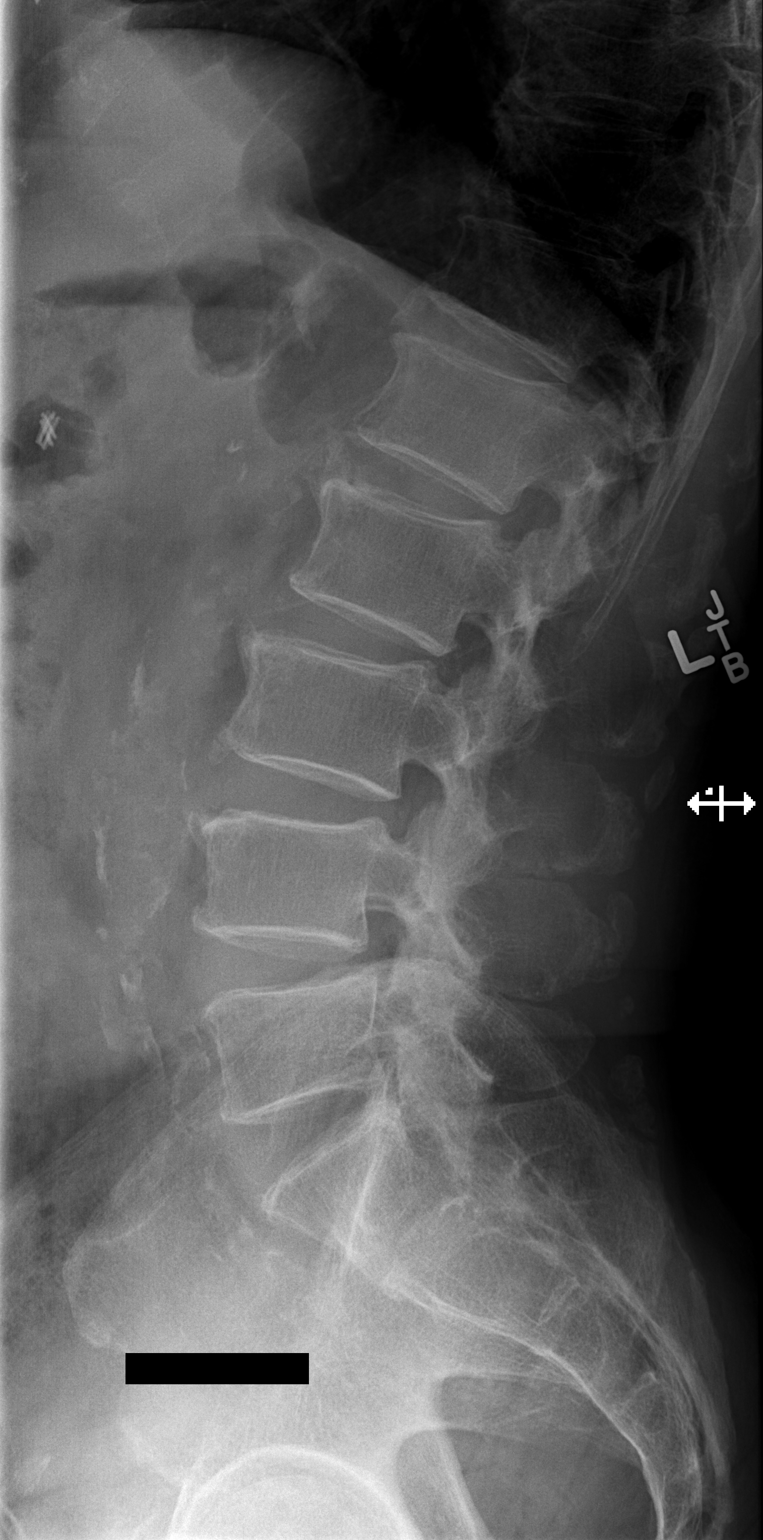

[2 of 2 positions shown; findings below may reference images not displayed]

FINDINGS: Lumbar alignment within normal limits. Aortic atherosclerosis.
Minimal chronic appearing wedging of T11 and T12. Mild degenerative
osteophytes throughout the lumbar spine with relatively preserved
disc spaces. Facet degenerative change of the lower lumbar spine.
IMPRESSION: Mild degenerative changes of the lumbar spine. No acute osseous
abnormality.

## 2021-08-06 IMAGING — DX DG SPINE 1V PORT
1 series · 1 of 1 positions shown · non-contrast
Comparison: Earlier same day.  06/29/2020. CT scan from 08/08/2010.

CLINICAL DATA: Lumbar surgery.  Intraoperative assessment.

EXAM:
PORTABLE SPINE - 1 VIEW

[l-spine lat]
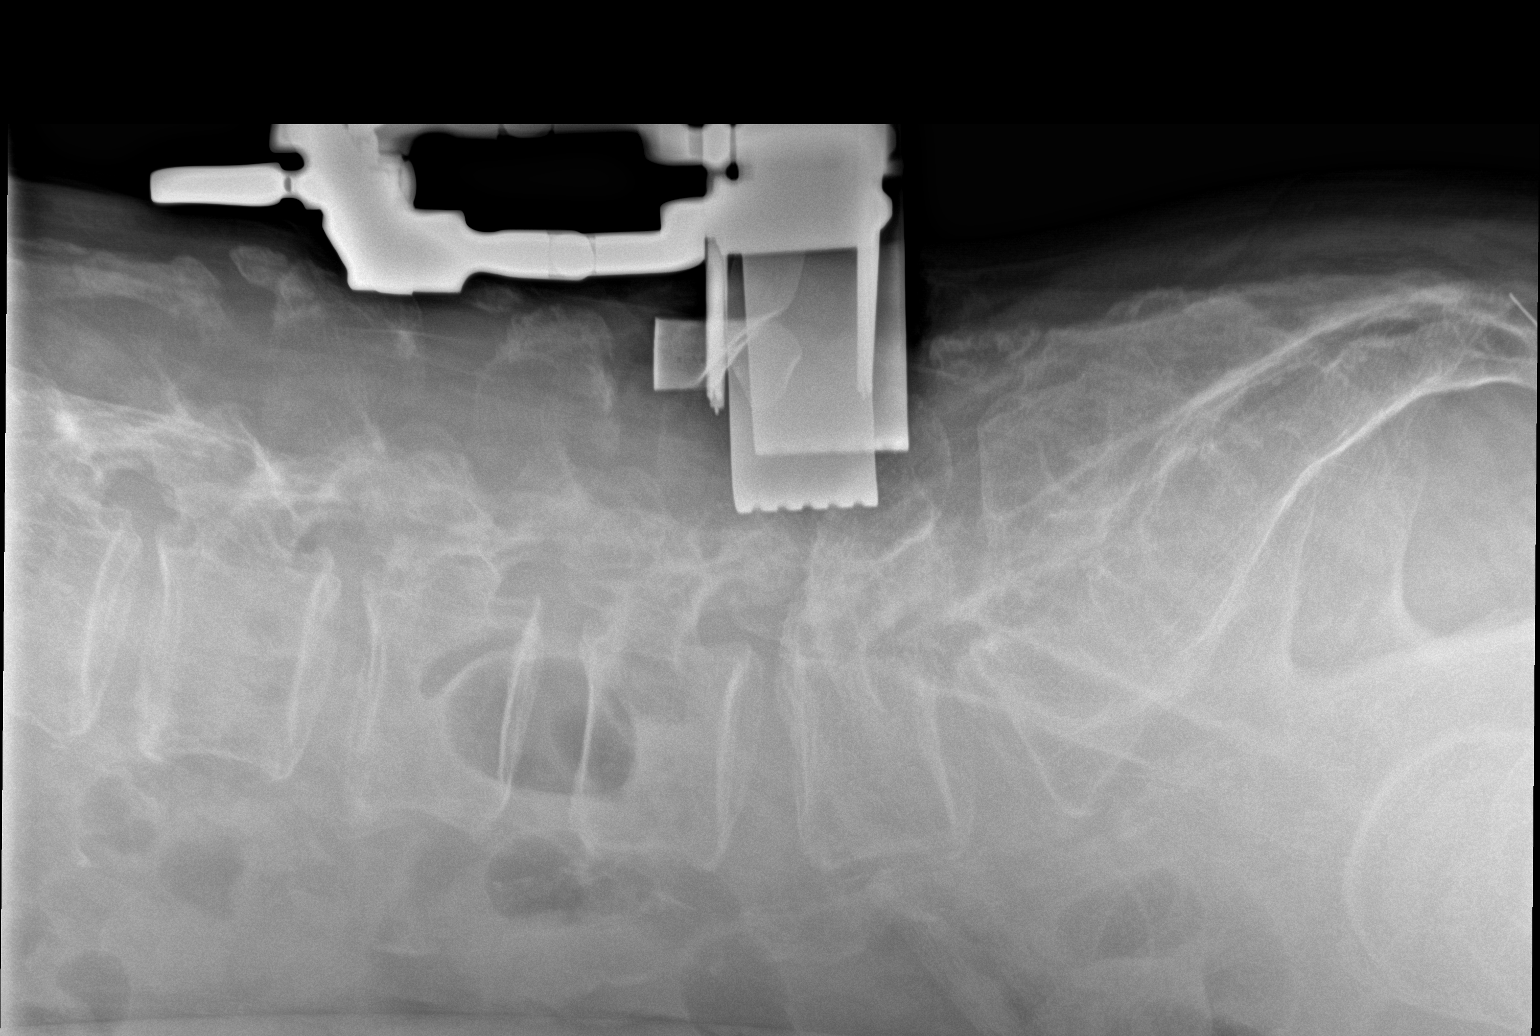

[1 of 1 positions shown; findings below may reference images not displayed]

FINDINGS: Using the same numbering scheme today as on the study from
06/29/2020, soft tissue retractors are identified in the posterior
lower back, in the region of the L4-5 level.
IMPRESSION: Intraoperative localization.

## 2021-10-03 DIAGNOSIS — R7303 Prediabetes: Secondary | ICD-10-CM | POA: Diagnosis not present

## 2021-10-03 DIAGNOSIS — J069 Acute upper respiratory infection, unspecified: Secondary | ICD-10-CM | POA: Diagnosis not present

## 2021-10-03 DIAGNOSIS — E7800 Pure hypercholesterolemia, unspecified: Secondary | ICD-10-CM | POA: Diagnosis not present

## 2021-10-03 DIAGNOSIS — I10 Essential (primary) hypertension: Secondary | ICD-10-CM | POA: Diagnosis not present

## 2022-02-20 DIAGNOSIS — H52223 Regular astigmatism, bilateral: Secondary | ICD-10-CM | POA: Diagnosis not present

## 2022-02-20 DIAGNOSIS — H35361 Drusen (degenerative) of macula, right eye: Secondary | ICD-10-CM | POA: Diagnosis not present

## 2022-02-20 DIAGNOSIS — Z961 Presence of intraocular lens: Secondary | ICD-10-CM | POA: Diagnosis not present

## 2022-02-20 DIAGNOSIS — H524 Presbyopia: Secondary | ICD-10-CM | POA: Diagnosis not present

## 2022-04-20 ENCOUNTER — Ambulatory Visit
Admission: RE | Admit: 2022-04-20 | Discharge: 2022-04-20 | Disposition: A | Discharge status: Home / Self Care | Payer: Medicare HMO | Source: Ambulatory Visit | Attending: Family Medicine | Admitting: Family Medicine

## 2022-04-20 ENCOUNTER — Other Ambulatory Visit: Payer: Self-pay | Admitting: Family Medicine

## 2022-04-20 DIAGNOSIS — Z0000 Encounter for general adult medical examination without abnormal findings: Secondary | ICD-10-CM | POA: Diagnosis not present

## 2022-04-20 DIAGNOSIS — R053 Chronic cough: Secondary | ICD-10-CM

## 2022-04-20 DIAGNOSIS — R946 Abnormal results of thyroid function studies: Secondary | ICD-10-CM | POA: Diagnosis not present

## 2022-04-20 DIAGNOSIS — I10 Essential (primary) hypertension: Secondary | ICD-10-CM | POA: Diagnosis not present

## 2022-04-20 DIAGNOSIS — R7303 Prediabetes: Secondary | ICD-10-CM | POA: Diagnosis not present

## 2022-04-20 DIAGNOSIS — R7301 Impaired fasting glucose: Secondary | ICD-10-CM | POA: Diagnosis not present

## 2022-04-20 DIAGNOSIS — Z79899 Other long term (current) drug therapy: Secondary | ICD-10-CM | POA: Diagnosis not present

## 2022-04-20 DIAGNOSIS — D692 Other nonthrombocytopenic purpura: Secondary | ICD-10-CM | POA: Diagnosis not present

## 2022-04-20 DIAGNOSIS — I2510 Atherosclerotic heart disease of native coronary artery without angina pectoris: Secondary | ICD-10-CM | POA: Diagnosis not present

## 2022-05-26 DIAGNOSIS — R9389 Abnormal findings on diagnostic imaging of other specified body structures: Secondary | ICD-10-CM | POA: Diagnosis not present

## 2022-05-26 DIAGNOSIS — I10 Essential (primary) hypertension: Secondary | ICD-10-CM | POA: Diagnosis not present

## 2022-05-26 DIAGNOSIS — R058 Other specified cough: Secondary | ICD-10-CM | POA: Diagnosis not present

## 2022-06-06 ENCOUNTER — Other Ambulatory Visit: Payer: Self-pay | Admitting: Family Medicine

## 2022-06-06 DIAGNOSIS — R9389 Abnormal findings on diagnostic imaging of other specified body structures: Secondary | ICD-10-CM

## 2022-06-06 DIAGNOSIS — R053 Chronic cough: Secondary | ICD-10-CM

## 2022-06-30 ENCOUNTER — Encounter (HOSPITAL_COMMUNITY): Payer: Self-pay | Admitting: Specialist

## 2022-07-05 ENCOUNTER — Ambulatory Visit
Admission: RE | Admit: 2022-07-05 | Discharge: 2022-07-05 | Disposition: A | Discharge status: Home / Self Care | Payer: Medicare HMO | Source: Ambulatory Visit | Attending: Family Medicine | Admitting: Family Medicine

## 2022-07-05 DIAGNOSIS — J439 Emphysema, unspecified: Secondary | ICD-10-CM | POA: Diagnosis not present

## 2022-07-05 DIAGNOSIS — I2510 Atherosclerotic heart disease of native coronary artery without angina pectoris: Secondary | ICD-10-CM | POA: Diagnosis not present

## 2022-07-05 DIAGNOSIS — R053 Chronic cough: Secondary | ICD-10-CM

## 2022-07-05 DIAGNOSIS — R093 Abnormal sputum: Secondary | ICD-10-CM | POA: Diagnosis not present

## 2022-07-05 DIAGNOSIS — R9389 Abnormal findings on diagnostic imaging of other specified body structures: Secondary | ICD-10-CM

## 2022-07-05 DIAGNOSIS — J9809 Other diseases of bronchus, not elsewhere classified: Secondary | ICD-10-CM | POA: Diagnosis not present

## 2022-07-05 MED ORDER — IOPAMIDOL (ISOVUE-370) INJECTION 76%
75.0000 mL | Freq: Once | INTRAVENOUS | Status: AC | PRN
  Administered 2022-07-05: 75 mL via INTRAVENOUS

## 2022-07-10 DIAGNOSIS — I700 Atherosclerosis of aorta: Secondary | ICD-10-CM | POA: Diagnosis not present

## 2022-07-10 DIAGNOSIS — J439 Emphysema, unspecified: Secondary | ICD-10-CM | POA: Diagnosis not present

## 2022-07-10 DIAGNOSIS — I2510 Atherosclerotic heart disease of native coronary artery without angina pectoris: Secondary | ICD-10-CM | POA: Diagnosis not present

## 2022-10-16 DIAGNOSIS — E7800 Pure hypercholesterolemia, unspecified: Secondary | ICD-10-CM | POA: Diagnosis not present

## 2022-10-16 DIAGNOSIS — Z85238 Personal history of other malignant neoplasm of thymus: Secondary | ICD-10-CM | POA: Diagnosis not present

## 2022-10-16 DIAGNOSIS — R7303 Prediabetes: Secondary | ICD-10-CM | POA: Diagnosis not present

## 2022-12-11 DIAGNOSIS — M5451 Vertebrogenic low back pain: Secondary | ICD-10-CM | POA: Diagnosis not present

## 2023-01-22 ENCOUNTER — Other Ambulatory Visit: Payer: Self-pay

## 2023-01-22 ENCOUNTER — Emergency Department (HOSPITAL_BASED_OUTPATIENT_CLINIC_OR_DEPARTMENT_OTHER)
Admission: EM | Admit: 2023-01-22 | Discharge: 2023-01-22 | Disposition: A | Discharge status: Home / Self Care | Payer: 59 | Attending: Emergency Medicine | Admitting: Emergency Medicine

## 2023-01-22 ENCOUNTER — Encounter (HOSPITAL_BASED_OUTPATIENT_CLINIC_OR_DEPARTMENT_OTHER): Payer: Self-pay | Admitting: Emergency Medicine

## 2023-01-22 ENCOUNTER — Other Ambulatory Visit (HOSPITAL_BASED_OUTPATIENT_CLINIC_OR_DEPARTMENT_OTHER): Payer: Self-pay

## 2023-01-22 DIAGNOSIS — H5789 Other specified disorders of eye and adnexa: Secondary | ICD-10-CM | POA: Diagnosis not present

## 2023-01-22 DIAGNOSIS — I10 Essential (primary) hypertension: Secondary | ICD-10-CM | POA: Diagnosis not present

## 2023-01-22 DIAGNOSIS — Z79899 Other long term (current) drug therapy: Secondary | ICD-10-CM | POA: Insufficient documentation

## 2023-01-22 MED ORDER — PREDNISONE 20 MG PO TABS
40.0000 mg | ORAL_TABLET | Freq: Every day | ORAL | 0 refills | Status: AC
  Filled 2023-01-22: qty 10, 5d supply, fill #0

## 2023-01-22 MED ORDER — PREDNISONE 50 MG PO TABS
60.0000 mg | ORAL_TABLET | Freq: Once | ORAL | Status: AC
  Administered 2023-01-22: 60 mg via ORAL
  Filled 2023-01-22: qty 1

## 2023-01-22 MED ORDER — ERYTHROMYCIN 5 MG/GM OP OINT
TOPICAL_OINTMENT | Freq: Once | OPHTHALMIC | Status: AC
  Administered 2023-01-22: 1 via OPHTHALMIC
  Filled 2023-01-22: qty 3.5

## 2023-01-22 NOTE — ED Notes (Signed)
Signature pad did not take signature for mse

## 2023-01-22 NOTE — ED Provider Notes (Signed)
[place] [facility] Provider Note   CSN: [id number] Arrival date & time: 01/22/23  0935     History  Chief Complaint  Patient presents with   Facial Swelling    [name] is a 82 y.o. male.  Patient here with swelling to both eyes and conjunctiva after doing yard work couple days ago.  He was using a leaf blowe.  History of allergies.  He has been using Benadryl with some improvement.  His eyes are been watery and red and has had a little bit of swelling under both eyes.  His eyes were crusted in the morning.  Nothing makes it worse or better.  Denies any difficulty breathing.  No nausea vomiting diarrhea.  The history is provided by the patient.       Home Medications Prior to Admission medications   Medication Sig Start Date End Date Taking? Authorizing Provider  predniSONE (DELTASONE) 20 MG tablet Take 2 tablets (40 mg total) by mouth daily for 5 days. 01/22/23 01/27/23 Yes Bellatrix Devonshire, DO  docusate sodium (COLACE) 100 MG capsule Take 1 capsule (100 mg total) by mouth 2 (two) times daily as needed for mild constipation. 06/30/20   Susa Day, MD  lisinopril-hydrochlorothiazide (PRINZIDE,ZESTORETIC) 20-12.5 MG per tablet Take 1 tablet by mouth daily.      [provider]  lovastatin (MEVACOR) 40 MG tablet Take 40 mg by mouth every evening.    [provider]  omeprazole (PRILOSEC) 40 MG capsule TAKE ONE CAPSULE BY MOUTH EVERY DAY Patient not taking: Reported on 06/28/2020 03/18/15   Irene Shipper, MD  polyethylene glycol (MIRALAX / GLYCOLAX) 17 g packet Take 17 g by mouth daily. 06/30/20   Susa Day, MD      Allergies    Patient has no active allergies.    Review of Systems   Review of Systems  Physical Exam Updated Vital Signs Ht 5\' 7"  (1.702 m)   Wt 73.7 kg   BMI 25.45 kg/m  Physical Exam Vitals and nursing note reviewed.  Constitutional:      General: He is not in acute distress.    Appearance: He is  well-developed.  HENT:     Head: Normocephalic and atraumatic.  Eyes:     Extraocular Movements: Extraocular movements intact.     Pupils: Pupils are equal, round, and reactive to light.     Comments: Conjunctiva inflamed bilaterally, there is a little bit of redness underneath both eyes, visual acuity is intact.  Extraocular movements are intact.  No pain with extraocular movements  Cardiovascular:     Rate and Rhythm: Normal rate and regular rhythm.     Heart sounds: No murmur heard. Pulmonary:     Effort: Pulmonary effort is normal. No respiratory distress.     Breath sounds: Normal breath sounds.  Abdominal:     Palpations: Abdomen is soft.     Tenderness: There is no abdominal tenderness.  Musculoskeletal:        General: No swelling.     Cervical back: Neck supple.  Skin:    General: Skin is warm and dry.     Capillary Refill: Capillary refill takes less than 2 seconds.  Neurological:     Mental Status: He is alert.  Psychiatric:        Mood and Affect: Mood normal.     ED Results / Procedures / Treatments   Labs (all labs ordered are listed, but only abnormal results are  displayed) Labs Reviewed - No data to display  EKG None  Radiology No results found.  Procedures Procedures    Medications Ordered in ED Medications  erythromycin ophthalmic ointment (has no administration in time range)  predniSONE (DELTASONE) tablet 60 mg (has no administration in time range)    ED Course/ Medical Decision Making/ A&P                             Medical Decision Making Risk Prescription drug management.   [name] is here with bilateral eye swelling.  Normal vitals.  No fever.  History of hypertension.  On exam he appears to have allergic conjunctivitis.  He was doing yard work couple days ago.  He states he has bad allergies to pollen.  Has been using Benadryl but he continues to have watery eyes and itchiness.  He is got a little bit of swelling under each  eye.  He has no pain with eye movement.  I have no concern for cellulitis or orbital cellulitis.  Will put him on erythromycin eye ointment and prednisone and have him follow-up with primary care doctor and ophthalmology if he is not improving.  Discharged in good condition.  Recommend he continue to use Benadryl.  This chart was dictated using voice recognition software.  Despite best efforts to proofread,  errors can occur which can change the documentation meaning.         Final Clinical Impression(s) / ED Diagnoses Final diagnoses:  Eye swelling, bilateral    Rx / DC Orders ED Discharge Orders          Ordered    predniSONE (DELTASONE) 20 MG tablet  Daily        01/22/23 1001              Maxwell, Quita Skye, DO 01/22/23 1005

## 2023-01-22 NOTE — ED Triage Notes (Signed)
Pt via pov from home with swollen eyes x 2 days. Pt also has nasal congestion; has been taking benadryl for symptom relief. Pt alert & oriented, nad noted. Family at bedside; Dr. Ronnald Nian at bedside.

## 2023-01-22 NOTE — Discharge Instructions (Signed)
Use antibiotic ointment provided to you today 4 times a day for the next 3 to 4 days.  Take next dose of steroid tomorrow.  Follow-up with eye doctor.

## 2023-01-29 DIAGNOSIS — H1013 Acute atopic conjunctivitis, bilateral: Secondary | ICD-10-CM | POA: Diagnosis not present

## 2023-02-05 DIAGNOSIS — H1013 Acute atopic conjunctivitis, bilateral: Secondary | ICD-10-CM | POA: Diagnosis not present

## 2023-02-19 DIAGNOSIS — H1013 Acute atopic conjunctivitis, bilateral: Secondary | ICD-10-CM | POA: Diagnosis not present

## 2023-03-06 DIAGNOSIS — M5451 Vertebrogenic low back pain: Secondary | ICD-10-CM | POA: Diagnosis not present

## 2023-03-06 DIAGNOSIS — M791 Myalgia, unspecified site: Secondary | ICD-10-CM | POA: Diagnosis not present

## 2023-03-12 DIAGNOSIS — H35363 Drusen (degenerative) of macula, bilateral: Secondary | ICD-10-CM | POA: Diagnosis not present

## 2023-03-30 DIAGNOSIS — H353211 Exudative age-related macular degeneration, right eye, with active choroidal neovascularization: Secondary | ICD-10-CM | POA: Diagnosis not present

## 2023-04-12 DIAGNOSIS — H35363 Drusen (degenerative) of macula, bilateral: Secondary | ICD-10-CM | POA: Diagnosis not present

## 2023-05-07 DIAGNOSIS — Z85238 Personal history of other malignant neoplasm of thymus: Secondary | ICD-10-CM | POA: Diagnosis not present

## 2023-05-07 DIAGNOSIS — Z79899 Other long term (current) drug therapy: Secondary | ICD-10-CM | POA: Diagnosis not present

## 2023-05-07 DIAGNOSIS — R7303 Prediabetes: Secondary | ICD-10-CM | POA: Diagnosis not present

## 2023-05-07 DIAGNOSIS — Z0000 Encounter for general adult medical examination without abnormal findings: Secondary | ICD-10-CM | POA: Diagnosis not present

## 2023-05-07 DIAGNOSIS — J439 Emphysema, unspecified: Secondary | ICD-10-CM | POA: Diagnosis not present

## 2023-05-07 DIAGNOSIS — E7800 Pure hypercholesterolemia, unspecified: Secondary | ICD-10-CM | POA: Diagnosis not present

## 2023-05-07 DIAGNOSIS — I700 Atherosclerosis of aorta: Secondary | ICD-10-CM | POA: Diagnosis not present

## 2023-08-10 DIAGNOSIS — J40 Bronchitis, not specified as acute or chronic: Secondary | ICD-10-CM | POA: Diagnosis not present

## 2023-08-10 DIAGNOSIS — R059 Cough, unspecified: Secondary | ICD-10-CM | POA: Diagnosis not present

## 2023-08-13 ENCOUNTER — Other Ambulatory Visit: Payer: Self-pay | Admitting: Family Medicine

## 2023-08-13 ENCOUNTER — Ambulatory Visit
Admission: RE | Admit: 2023-08-13 | Discharge: 2023-08-13 | Disposition: A | Discharge status: Home / Self Care | Payer: 59 | Source: Ambulatory Visit | Attending: Family Medicine | Admitting: Family Medicine

## 2023-08-13 DIAGNOSIS — R058 Other specified cough: Secondary | ICD-10-CM

## 2023-08-13 DIAGNOSIS — R059 Cough, unspecified: Secondary | ICD-10-CM | POA: Diagnosis not present

## 2023-08-21 DIAGNOSIS — M5451 Vertebrogenic low back pain: Secondary | ICD-10-CM | POA: Diagnosis not present

## 2023-08-21 DIAGNOSIS — M791 Myalgia, unspecified site: Secondary | ICD-10-CM | POA: Diagnosis not present

## 2023-10-09 DIAGNOSIS — R591 Generalized enlarged lymph nodes: Secondary | ICD-10-CM | POA: Diagnosis not present

## 2023-10-09 DIAGNOSIS — R21 Rash and other nonspecific skin eruption: Secondary | ICD-10-CM | POA: Diagnosis not present

## 2023-10-09 DIAGNOSIS — J029 Acute pharyngitis, unspecified: Secondary | ICD-10-CM | POA: Diagnosis not present

## 2023-10-09 DIAGNOSIS — R053 Chronic cough: Secondary | ICD-10-CM | POA: Diagnosis not present

## 2023-10-12 DIAGNOSIS — R21 Rash and other nonspecific skin eruption: Secondary | ICD-10-CM | POA: Diagnosis not present

## 2023-10-12 DIAGNOSIS — J069 Acute upper respiratory infection, unspecified: Secondary | ICD-10-CM | POA: Diagnosis not present

## 2023-11-06 ENCOUNTER — Emergency Department (HOSPITAL_COMMUNITY): Payer: Medicare HMO

## 2023-11-06 ENCOUNTER — Other Ambulatory Visit: Payer: Self-pay

## 2023-11-06 ENCOUNTER — Encounter (HOSPITAL_COMMUNITY): Payer: Self-pay | Admitting: Emergency Medicine

## 2023-11-06 ENCOUNTER — Emergency Department (HOSPITAL_COMMUNITY)
Admission: EM | Admit: 2023-11-06 | Discharge: 2023-11-07 | Disposition: A | Discharge status: Home / Self Care | Payer: Medicare HMO | Attending: Emergency Medicine | Admitting: Emergency Medicine

## 2023-11-06 DIAGNOSIS — R9431 Abnormal electrocardiogram [ECG] [EKG]: Secondary | ICD-10-CM | POA: Diagnosis not present

## 2023-11-06 DIAGNOSIS — Z79899 Other long term (current) drug therapy: Secondary | ICD-10-CM | POA: Insufficient documentation

## 2023-11-06 DIAGNOSIS — S299XXA Unspecified injury of thorax, initial encounter: Secondary | ICD-10-CM | POA: Diagnosis present

## 2023-11-06 DIAGNOSIS — S0990XA Unspecified injury of head, initial encounter: Secondary | ICD-10-CM | POA: Diagnosis not present

## 2023-11-06 DIAGNOSIS — I10 Essential (primary) hypertension: Secondary | ICD-10-CM | POA: Insufficient documentation

## 2023-11-06 DIAGNOSIS — R0781 Pleurodynia: Secondary | ICD-10-CM | POA: Diagnosis not present

## 2023-11-06 DIAGNOSIS — S20212A Contusion of left front wall of thorax, initial encounter: Secondary | ICD-10-CM | POA: Diagnosis not present

## 2023-11-06 DIAGNOSIS — Y92410 Unspecified street and highway as the place of occurrence of the external cause: Secondary | ICD-10-CM | POA: Diagnosis not present

## 2023-11-06 DIAGNOSIS — R58 Hemorrhage, not elsewhere classified: Secondary | ICD-10-CM | POA: Diagnosis not present

## 2023-11-06 DIAGNOSIS — R079 Chest pain, unspecified: Secondary | ICD-10-CM | POA: Diagnosis not present

## 2023-11-06 MED ORDER — OXYCODONE HCL 5 MG PO TABS: 5.0000 mg | ORAL_TABLET | Freq: Once | ORAL | Status: DC
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
## 2023-11-07 NOTE — Discharge Instructions (Signed)
 Your x-ray today was reassuring.  There were no signs of fractures on your imaging.  Please use the incentive spirometer that was provided.  You may take up to 600 mg of ibuprofen every 6 hours and may take up to 1000 mg of Tylenol  every 8 hours.  Please follow-up with your primary care provider as needed.  If you develop shortness of breath or other life-threatening symptoms please return to the emergency department.
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
## 2023-11-16 DIAGNOSIS — R7303 Prediabetes: Secondary | ICD-10-CM | POA: Diagnosis not present

## 2023-11-16 DIAGNOSIS — J439 Emphysema, unspecified: Secondary | ICD-10-CM | POA: Diagnosis not present

## 2023-11-16 DIAGNOSIS — I700 Atherosclerosis of aorta: Secondary | ICD-10-CM | POA: Diagnosis not present

## 2023-11-16 DIAGNOSIS — E7800 Pure hypercholesterolemia, unspecified: Secondary | ICD-10-CM | POA: Diagnosis not present

## 2023-11-16 DIAGNOSIS — I2510 Atherosclerotic heart disease of native coronary artery without angina pectoris: Secondary | ICD-10-CM | POA: Diagnosis not present

## 2023-11-23 DIAGNOSIS — H353211 Exudative age-related macular degeneration, right eye, with active choroidal neovascularization: Secondary | ICD-10-CM | POA: Diagnosis not present

## 2023-11-23 DIAGNOSIS — H524 Presbyopia: Secondary | ICD-10-CM | POA: Diagnosis not present

## 2023-11-26 DIAGNOSIS — Z0100 Encounter for examination of eyes and vision without abnormal findings: Secondary | ICD-10-CM | POA: Diagnosis not present

## 2024-01-04 DIAGNOSIS — M5416 Radiculopathy, lumbar region: Secondary | ICD-10-CM | POA: Diagnosis not present

## 2024-01-04 DIAGNOSIS — M7918 Myalgia, other site: Secondary | ICD-10-CM | POA: Diagnosis not present

## 2024-05-16 DIAGNOSIS — R7303 Prediabetes: Secondary | ICD-10-CM | POA: Diagnosis not present

## 2024-05-16 DIAGNOSIS — Z79899 Other long term (current) drug therapy: Secondary | ICD-10-CM | POA: Diagnosis not present

## 2024-05-16 DIAGNOSIS — R053 Chronic cough: Secondary | ICD-10-CM | POA: Diagnosis not present

## 2024-05-16 DIAGNOSIS — Z0000 Encounter for general adult medical examination without abnormal findings: Secondary | ICD-10-CM | POA: Diagnosis not present

## 2024-05-16 DIAGNOSIS — Z85238 Personal history of other malignant neoplasm of thymus: Secondary | ICD-10-CM | POA: Diagnosis not present

## 2024-05-16 DIAGNOSIS — N401 Enlarged prostate with lower urinary tract symptoms: Secondary | ICD-10-CM | POA: Diagnosis not present

## 2024-05-16 DIAGNOSIS — E7800 Pure hypercholesterolemia, unspecified: Secondary | ICD-10-CM | POA: Diagnosis not present

## 2024-06-09 DIAGNOSIS — J302 Other seasonal allergic rhinitis: Secondary | ICD-10-CM | POA: Diagnosis not present

## 2024-06-09 DIAGNOSIS — G4452 New daily persistent headache (NDPH): Secondary | ICD-10-CM | POA: Diagnosis not present

## 2024-06-10 ENCOUNTER — Other Ambulatory Visit (HOSPITAL_COMMUNITY): Payer: Self-pay | Admitting: Family Medicine

## 2024-06-10 DIAGNOSIS — G4452 New daily persistent headache (NDPH): Secondary | ICD-10-CM

## 2024-06-11 ENCOUNTER — Ambulatory Visit (HOSPITAL_COMMUNITY)
Admission: RE | Admit: 2024-06-11 | Discharge: 2024-06-11 | Disposition: A | Discharge status: Home / Self Care | Source: Ambulatory Visit | Attending: Family Medicine | Admitting: Family Medicine

## 2024-06-11 DIAGNOSIS — I6782 Cerebral ischemia: Secondary | ICD-10-CM | POA: Diagnosis not present

## 2024-06-11 DIAGNOSIS — G4452 New daily persistent headache (NDPH): Secondary | ICD-10-CM | POA: Insufficient documentation

## 2024-06-11 DIAGNOSIS — R519 Headache, unspecified: Secondary | ICD-10-CM | POA: Diagnosis not present

## 2024-06-11 MED ORDER — GADOBUTROL 1 MMOL/ML IV SOLN
7.5000 mL | Freq: Once | INTRAVENOUS | Status: AC | PRN
Start: 2024-06-11 — End: 2024-06-11
  Administered 2024-06-11 (×2): 7.5 mL via INTRAVENOUS

## 2024-07-01 DIAGNOSIS — R519 Headache, unspecified: Secondary | ICD-10-CM | POA: Diagnosis not present
